# Patient Record
Sex: Female | Born: 1990 | Race: White | Hispanic: No | Marital: Single | Smoking: Never smoker
Health system: Southern US, Community
[De-identification: ages and names within clinical notes are randomized; demographics above are authoritative.]

## PROBLEM LIST (undated history)

## (undated) DIAGNOSIS — N1370 Vesicoureteral-reflux, unspecified: Secondary | ICD-10-CM

## (undated) DIAGNOSIS — R51 Headache: Secondary | ICD-10-CM

## (undated) DIAGNOSIS — R519 Headache, unspecified: Secondary | ICD-10-CM

## (undated) DIAGNOSIS — B019 Varicella without complication: Secondary | ICD-10-CM

## (undated) DIAGNOSIS — J45990 Exercise induced bronchospasm: Secondary | ICD-10-CM

## (undated) HISTORY — DX: Exercise induced bronchospasm: J45.990

## (undated) HISTORY — DX: Headache: R51

## (undated) HISTORY — DX: Headache, unspecified: R51.9

## (undated) HISTORY — DX: Varicella without complication: B01.9

## (undated) HISTORY — DX: Vesicoureteral-reflux, unspecified: N13.70

---

## 2012-08-04 ENCOUNTER — Ambulatory Visit: Payer: Self-pay | Admitting: Family Medicine

## 2014-03-25 LAB — HM PAP SMEAR: HM PAP: NEGATIVE

## 2014-12-13 ENCOUNTER — Ambulatory Visit: Payer: Self-pay | Admitting: Internal Medicine

## 2014-12-19 ENCOUNTER — Encounter: Payer: Self-pay | Admitting: Internal Medicine

## 2014-12-19 ENCOUNTER — Ambulatory Visit (INDEPENDENT_AMBULATORY_CARE_PROVIDER_SITE_OTHER): Payer: BC Managed Care – PPO | Admitting: Internal Medicine

## 2014-12-19 VITALS — BP 120/70 | HR 68 | Temp 98.3°F | Ht 65.5 in | Wt 130.0 lb

## 2014-12-19 DIAGNOSIS — Z7189 Other specified counseling: Secondary | ICD-10-CM

## 2014-12-19 DIAGNOSIS — N920 Excessive and frequent menstruation with regular cycle: Secondary | ICD-10-CM

## 2014-12-19 DIAGNOSIS — J45990 Exercise induced bronchospasm: Secondary | ICD-10-CM | POA: Diagnosis not present

## 2014-12-19 DIAGNOSIS — Z23 Encounter for immunization: Secondary | ICD-10-CM

## 2014-12-19 DIAGNOSIS — J302 Other seasonal allergic rhinitis: Secondary | ICD-10-CM

## 2014-12-19 DIAGNOSIS — Z7184 Encounter for health counseling related to travel: Secondary | ICD-10-CM

## 2014-12-19 MED ORDER — TYPHOID VACCINE PO CPDR: DELAYED_RELEASE_CAPSULE | ORAL | Status: DC

## 2014-12-19 NOTE — Assessment & Plan Note (Signed)
Not an issue as an adult Will continue to monitor

## 2014-12-19 NOTE — Progress Notes (Signed)
Pre visit review using our clinic review tool, if applicable. No additional management support is needed unless otherwise documented below in the visit note. 

## 2014-12-19 NOTE — Progress Notes (Signed)
   Subjective:    Patient ID: Mary Hurst, female    DOB: 06/19/1991, 24 y.o.   MRN: [id number]  HPI    Review of Systems     Objective:   Physical Exam        Assessment & Plan:

## 2014-12-19 NOTE — Assessment & Plan Note (Signed)
Controlled on current therapy ?

## 2014-12-19 NOTE — Progress Notes (Signed)
HPI  Pt presents to the clinic today to establish care and for management of the conditions listed below. She also reports that she is going on a medical missions trip in June to the [place]. She needs to know what vaccines she needs. She did bring her vaccination record with her.  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 Has not been an issue as an adult. She does not even have an albuterol inhaler.  Allergies: Moderate all year long. She takes Flonase, Claritin and Singulair daily.  She also reports she has had a continuous period for the last 3 weeks. She is going through 2-3 tampons per day. She has had some pelvic cramping. She is sexually active but is taking birth control daily. She does have an appt with GYN tomorrow.  Past Medical History  Diagnosis Date  . Exercise-induced asthma   . Chicken pox   . Frequent headaches   . Vesicoureteral reflux     Current Outpatient Prescriptions  Medication Sig Dispense Refill  . fluticasone (FLONASE) 50 MCG/ACT nasal spray Place 2 sprays into both nostrils daily.  12  . loratadine (CLARITIN) 10 MG tablet Take 1 tablet by mouth daily.  12  . montelukast (SINGULAIR) 10 MG tablet Take 10 mg by mouth daily.  6  . VELIVET 0.1/0.125/0.15 -0.025 MG tablet Take 1 tablet by mouth daily.  3   No current facility-administered medications for this visit.    No Known Allergies  Family History  Problem Relation Age of Onset  . Stroke Maternal Grandmother   . Hypertension Maternal Grandmother   . Cancer Maternal Grandfather     Prostate    History   Social History  . Marital Status: Single    Spouse Name: N/A  . Number of Children: N/A  . Years of Education: N/A   Occupational History  . Not on file.   Social History Main Topics  . Smoking status: Never Smoker   . Smokeless tobacco: Never Used  . Alcohol Use: 0.0 oz/week    0 Standard drinks or equivalent per week     Comment: rare  . Drug Use: No  . Sexual Activity: Not on  file   Other Topics Concern  . Not on file   Social History Narrative  . No narrative on file    ROS:  Constitutional: Denies fever, malaise, fatigue, headache or abrupt weight changes.  Respiratory: Denies difficulty breathing, shortness of breath, cough or sputum production.   Cardiovascular: Denies chest pain, chest tightness, palpitations or swelling in the hands or feet.  Gastrointestinal: Pt reports abdominal cramping. Denies bloating, constipation, diarrhea or blood in the stool.  GU: Denies frequency, urgency, pain with urination, blood in urine, odor or discharge. Neurological: Denies dizziness, difficulty with memory, difficulty with speech or problems with balance and coordination.   No other specific complaints in a complete review of systems (except as listed in HPI above).  PE:  BP 120/70 mmHg  Pulse 68  Temp(Src) 98.3 F (36.8 C) (Oral)  Ht 5' 5.5" (1.664 m)  Wt 130 lb (58.968 kg)  BMI 21.30 kg/m2  SpO2 99%  LMP 12/02/2014 Wt Readings from Last 3 Encounters:  12/19/14 130 lb (58.968 kg)    General: Appears her stated age, well developed, well nourished in NAD. Cardiovascular: Normal rate and rhythm. S1,S2 noted.  No murmur, rubs or gallops noted.  Pulmonary/Chest: Normal effort and positive vesicular breath sounds. No respiratory distress. No wheezes, rales or ronchi noted.  Abdomen:  Soft and nontender. Normal bowel sounds, no bruits noted. No distention or masses noted. Liver, spleen and kidneys non palpable. Pelvic: Deferred, she would rather see gyn. Neurological: Alert and oriented.  Psychiatric: Mood and affect normal. Behavior is normal. Judgment and thought content normal.     Assessment and Plan:  Travel Visit:  CDC travel website reviewed She has had TB skin test- negative She has not had Hep A but because she can not complete the series before she goes, she would like to discuss with her parents first eRx for Typhoid vaccine She will get  RX for Malaria vaccine through the program she is going with All other immunizations UTD  Menorrhagia:  Will not do any workup at this time She has appt with GYN tomorrow  RTC in 1 year or sooner if needed

## 2014-12-19 NOTE — Patient Instructions (Signed)

## 2014-12-20 LAB — CBC AND DIFFERENTIAL
HCT: 39 % (ref 36–46)
Hemoglobin: 13.3 g/dL (ref 12.0–16.0)
Platelets: 178 10*3/uL (ref 150–399)
WBC: 4.6 10^3/mL

## 2014-12-20 LAB — TSH: TSH: 1.73 u[IU]/mL (ref 0.41–5.90)

## 2014-12-28 ENCOUNTER — Encounter: Payer: Self-pay | Admitting: Obstetrics & Gynecology

## 2014-12-28 ENCOUNTER — Ambulatory Visit (INDEPENDENT_AMBULATORY_CARE_PROVIDER_SITE_OTHER): Payer: BC Managed Care – PPO | Admitting: Obstetrics & Gynecology

## 2014-12-28 VITALS — BP 114/77 | HR 75 | Ht 72.0 in | Wt 145.6 lb

## 2014-12-28 DIAGNOSIS — N938 Other specified abnormal uterine and vaginal bleeding: Secondary | ICD-10-CM | POA: Diagnosis not present

## 2014-12-28 MED ORDER — MISOPROSTOL 200 MCG PO TABS: ORAL_TABLET | ORAL | Status: DC

## 2014-12-28 NOTE — Progress Notes (Signed)
   Subjective:    Patient ID: Mary Hurst, female    DOB: 03/20/1991, 24 y.o.   MRN: [id number]  HPI  24 yo engaged W G0 here because she has bled for the last 3 weeks. She missed 2 pills of her OCPs and believes that this "kick started" her bleeding. Her periods last about a week, but are heavy since menarche at about 24 yo. She also has cramps with her periods and this is no better with OCPs.  Review of Systems She had a pap smear about a year ago at [facility] and says that it was normal. She has been on OCPs for about a year.  She is a Holiday representativesenior at [employer], graduates in the fall. She had TSH recently and it was reportedly normal. Her hemoglobin was reportedly normal. She reports occasional dyspareunia, monogamous for 2 years     Objective:   Physical Exam  [id number] Breathing and ambulating normally Neuro intact      Assessment & Plan:  Dysparenia/menorrhagia- check gyn u/s, cervical cultures

## 2014-12-30 LAB — GC/CHLAMYDIA PROBE AMP
CT Probe RNA: NEGATIVE
GC Probe RNA: NEGATIVE

## 2015-01-04 ENCOUNTER — Ambulatory Visit: Payer: BC Managed Care – PPO

## 2015-01-04 ENCOUNTER — Ambulatory Visit
Admission: RE | Admit: 2015-01-04 | Discharge: 2015-01-04 | Disposition: A | Discharge status: Home / Self Care | Payer: BC Managed Care – PPO | Source: Ambulatory Visit | Attending: Obstetrics & Gynecology | Admitting: Obstetrics & Gynecology

## 2015-01-04 DIAGNOSIS — N938 Other specified abnormal uterine and vaginal bleeding: Secondary | ICD-10-CM | POA: Diagnosis not present

## 2015-01-09 ENCOUNTER — Encounter: Payer: Self-pay | Admitting: *Deleted

## 2015-01-09 ENCOUNTER — Telehealth: Payer: Self-pay | Admitting: *Deleted

## 2015-01-09 NOTE — Telephone Encounter (Signed)
Notified patient of ultrasound results.  She has additional questions regarding these results including can she still get the [facility] IUD with this variant in her uterus.  Please call patient and discuss with her.  She does have an appointment in two weeks to have Skyla inserted but wants to know if this is even possible in light of the ultrasound results.

## 2015-01-13 ENCOUNTER — Telehealth: Payer: Self-pay | Admitting: Obstetrics & Gynecology

## 2015-01-13 NOTE — Telephone Encounter (Signed)
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 I advised that IUD is probably not the best option for her. She will consider her options.  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

## 2015-01-18 ENCOUNTER — Encounter: Payer: Self-pay | Admitting: Obstetrics and Gynecology

## 2015-01-25 ENCOUNTER — Ambulatory Visit (INDEPENDENT_AMBULATORY_CARE_PROVIDER_SITE_OTHER): Payer: BC Managed Care – PPO | Admitting: Family Medicine

## 2015-01-25 ENCOUNTER — Encounter: Payer: Self-pay | Admitting: Family Medicine

## 2015-01-25 VITALS — BP 107/67 | HR 68 | Wt 143.0 lb

## 2015-01-25 DIAGNOSIS — Q632 Ectopic kidney: Secondary | ICD-10-CM | POA: Diagnosis not present

## 2015-01-25 DIAGNOSIS — Q519 Congenital malformation of uterus and cervix, unspecified: Secondary | ICD-10-CM

## 2015-01-25 DIAGNOSIS — N921 Excessive and frequent menstruation with irregular cycle: Secondary | ICD-10-CM | POA: Diagnosis not present

## 2015-01-25 DIAGNOSIS — Z3041 Encounter for surveillance of contraceptive pills: Secondary | ICD-10-CM | POA: Diagnosis not present

## 2015-01-25 MED ORDER — DESOGEST-ETH ESTRAD TRIPHASIC 0.1/0.125/0.15 -0.025 MG PO TABS
1.0000 | ORAL_TABLET | Freq: Every day | ORAL | Status: DC
Start: 2015-01-25 — End: 2015-04-26

## 2015-01-25 NOTE — Assessment & Plan Note (Signed)
Not be contributing to abnormal bleeding.

## 2015-01-25 NOTE — Patient Instructions (Signed)
Abnormal Uterine Bleeding Abnormal uterine bleeding can affect women at various stages in life, including teenagers, women in their reproductive years, pregnant women, and women who have reached menopause. Several kinds of uterine bleeding are considered abnormal, including:  Bleeding or spotting between periods.   Bleeding after sexual intercourse.   Bleeding that is heavier or more than normal.   Periods that last longer than usual.  Bleeding after menopause.  Many cases of abnormal uterine bleeding are minor and simple to treat, while others are more serious. Any type of abnormal bleeding should be evaluated by your health care provider. Treatment will depend on the cause of the bleeding. HOME CARE INSTRUCTIONS Monitor your condition for any changes. The following actions may help to alleviate any discomfort you are experiencing:  Avoid the use of tampons and douches as directed by your health care provider.  Change your pads frequently. You should get regular pelvic exams and Pap tests. Keep all follow-up appointments for diagnostic tests as directed by your health care provider.  SEEK MEDICAL CARE IF:   Your bleeding lasts more than 1 week.   You feel dizzy at times.  SEEK IMMEDIATE MEDICAL CARE IF:   You pass out.   You are changing pads every 15 to 30 minutes.   You have abdominal pain.  You have a fever.   You become sweaty or weak.   You are passing large blood clots from the vagina.   You start to feel nauseous and vomit. MAKE SURE YOU:   Understand these instructions.  Will watch your condition.  Will get help right away if you are not doing well or get worse. Document Released: 07/15/2005 Document Revised: 07/20/2013 Document Reviewed: 02/11/2013 [facility] Patient Information 2015 [employer] [place]. This information is not intended to replace advice given to you by your health care provider. Make sure you discuss any questions you have with your  health care provider. Oral Contraception Information Oral contraceptive pills (OCPs) are medicines taken to prevent pregnancy. OCPs work by preventing the ovaries from releasing eggs. The hormones in OCPs also cause the cervical mucus to thicken, preventing the sperm from entering the uterus. The hormones also cause the uterine lining to become thin, not allowing a fertilized egg to attach to the inside of the uterus. OCPs are highly effective when taken exactly as prescribed. However, OCPs do not prevent sexually transmitted diseases (STDs). Safe sex practices, such as using condoms along with the pill, can help prevent STDs.  Before taking the pill, you may have a physical exam and Pap test. Your health care provider may order blood tests. The health care provider will make sure you are a good candidate for oral contraception. Discuss with your health care provider the possible side effects of the OCP you may be prescribed. When starting an OCP, it can take 2 to 3 months for the body to adjust to the changes in hormone levels in your body.  TYPES OF ORAL CONTRACEPTION  The combination pill--This pill contains estrogen and progestin (synthetic progesterone) hormones. The combination pill comes in 21-day, 28-day, or 91-day packs. Some types of combination pills are meant to be taken continuously (365-day pills). With 21-day packs, you do not take pills for 7 days after the last pill. With 28-day packs, the pill is taken every day. The last 7 pills are without hormones. Certain types of pills have more than 21 hormone-containing pills. With 91-day packs, the first 84 pills contain both hormones, and the last 7  pills contain no hormones or contain estrogen only. °· The minipill--This pill contains the progesterone hormone only. The pill is taken every day continuously. It is very important to take the pill at the same time each day. The minipill comes in packs of 28 pills. All 28 pills contain the hormone.    °ADVANTAGES OF ORAL CONTRACEPTIVE PILLS °· Decreases premenstrual symptoms.   °· Treats menstrual period cramps.   °· Regulates the menstrual cycle.   °· Decreases a heavy menstrual flow.   °· May treat acne, depending on the type of pill.   °· Treats abnormal uterine bleeding.   °· Treats polycystic ovarian syndrome.   °· Treats endometriosis.   °· Can be used as emergency contraception.   °THINGS THAT CAN MAKE ORAL CONTRACEPTIVE PILLS LESS EFFECTIVE °OCPs can be less effective if:  °· You forget to take the pill at the same time every day.   °· You have a stomach or intestinal disease that lessens the absorption of the pill.   °· You take OCPs with other medicines that make OCPs less effective, such as antibiotics, certain HIV medicines, and some seizure medicines.   °· You take expired OCPs.   °· You forget to restart the pill on day 7, when using the packs of 21 pills.   °RISKS ASSOCIATED WITH ORAL CONTRACEPTIVE PILLS  °Oral contraceptive pills can sometimes cause side effects, such as: °· Headache. °· Nausea. °· Breast tenderness. °· Irregular bleeding or spotting. °Combination pills are also associated with a small increased risk of: °· Blood clots. °· Heart attack. °· Stroke. °Document Released: 10/05/2002 Document Revised: 05/05/2013 Document Reviewed: 01/03/2013 °Exit[employer]® Patient Information ©2015 [employer]. This information is not intended to replace advice given to you by your health care provider. Make sure you discuss any questions you have with your health care provider. ° °

## 2015-01-25 NOTE — Progress Notes (Signed)
    Subjective:    Patient ID: Mary Hurst is a 24 y.o. female presenting with Follow-up  on 01/25/2015  HPI: Here to f/u pelvic sonogram and abnormal bleeding. Reports bleeding without missing pills.  On a 20 mcg pill due to intolerability of 35 mcg pill. 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  Review of Systems  Constitutional: Negative for fever and chills.  Respiratory: Negative for shortness of breath.   Cardiovascular: Negative for chest pain.  Gastrointestinal: Negative for nausea, vomiting and abdominal pain.  Genitourinary: Negative for dysuria.  Skin: Negative for rash.      Objective:    BP 107/67 mmHg  Pulse 68  Wt 143 lb (64.864 kg)  LMP 01/21/2015 Physical Exam  Constitutional: She is oriented to person, place, and time. She appears well-developed and well-nourished. No distress.  HENT:  Head: Normocephalic and atraumatic.  Eyes: No scleral icterus.  Neck: Neck supple.  Cardiovascular: Normal rate.   Pulmonary/Chest: Effort normal.  Abdominal: Soft.  Neurological: She is alert and oriented to person, place, and time.  Skin: Skin is warm and dry.  Psychiatric: She has a normal mood and affect.        Assessment & Plan:   Problem List Items Addressed This Visit      Unprioritized   Menorrhagia with irregular cycle - Primary    Probably related to 20 mcg pill.  Increase to 25 mcg pill and see how she does.  This is a triphasic, but hopefully will improve bleeding profile.  If not, could consider 30 mcg monophasic pill.      Relevant Medications   desogestrel-ethinyl estradiol (CYCLESSA) 0.1/0.125/0.15 -0.025 MG tablet   Uterine anomaly    Not be contributing to abnormal bleeding.      Pelvic kidney       Return in about 3 months (around 04/27/2015).  Shafer Swamy S 01/25/2015 10:36 AM

## 2015-01-25 NOTE — Assessment & Plan Note (Signed)
Probably related to 20 mcg pill.  Increase to 25 mcg pill and see how she does.  This is a triphasic, but hopefully will improve bleeding profile.  If not, could consider 30 mcg monophasic pill.

## 2015-01-26 MED ORDER — NORETHINDRONE ACET-ETHINYL EST 1.5-30 MG-MCG PO TABS: 1.0000 | ORAL_TABLET | Freq: Every day | ORAL | Status: DC

## 2015-01-26 NOTE — Addendum Note (Signed)
Addended by: Reva BoresPRATT, Omar Orrego S on: 01/26/2015 09:14 AM   Modules accepted: Orders

## 2015-02-08 ENCOUNTER — Other Ambulatory Visit: Payer: Self-pay | Admitting: Family Medicine

## 2015-02-08 DIAGNOSIS — Q519 Congenital malformation of uterus and cervix, unspecified: Secondary | ICD-10-CM

## 2015-02-20 ENCOUNTER — Ambulatory Visit (HOSPITAL_COMMUNITY): Payer: BC Managed Care – PPO

## 2015-04-26 ENCOUNTER — Encounter: Payer: Self-pay | Admitting: Podiatry

## 2015-04-26 ENCOUNTER — Ambulatory Visit (INDEPENDENT_AMBULATORY_CARE_PROVIDER_SITE_OTHER): Payer: BC Managed Care – PPO | Admitting: Podiatry

## 2015-04-26 ENCOUNTER — Ambulatory Visit (INDEPENDENT_AMBULATORY_CARE_PROVIDER_SITE_OTHER): Payer: BC Managed Care – PPO

## 2015-04-26 VITALS — BP 125/67 | HR 72 | Resp 16 | Ht 66.0 in | Wt 140.0 lb

## 2015-04-26 DIAGNOSIS — M79672 Pain in left foot: Secondary | ICD-10-CM

## 2015-04-26 DIAGNOSIS — L600 Ingrowing nail: Secondary | ICD-10-CM

## 2015-04-26 MED ORDER — NEOMYCIN-POLYMYXIN-HC 3.5-10000-1 OT SOLN: OTIC | Status: DC

## 2015-04-26 MED ORDER — HYDROCODONE-ACETAMINOPHEN 5-325 MG PO TABS: 1.0000 | ORAL_TABLET | Freq: Four times a day (QID) | ORAL | Status: DC | PRN

## 2015-04-26 NOTE — Progress Notes (Signed)
Subjective:     Patient ID: Mary Hurst, female   DOB: 1991/07/16, 24 y.o.   MRN: [id number]  HPI patient states I've had bad ingrown toenails on both my big toes for a long time and worse over the last year. Also my fourth toe on my left foot is been sore red bumped it 4 months ago and I'm worried I may have broken   Review of Systems  All other systems reviewed and are negative.      Objective:   Physical Exam  Constitutional: She is oriented to person, place, and time.  Cardiovascular: Intact distal pulses.   Musculoskeletal: Normal range of motion.  Neurological: She is oriented to person, place, and time.  Skin: Skin is warm.  Nursing note and vitals reviewed.  neurovascular status intact muscle strength adequate range of motion within normal limits. Patient's noted to have good digital perfusion is well oriented 3 and has incurvated hallux nailbeds bilateral with both pain in the medial and lateral side. Fourth toe left it's painful at the interphalangeal joint proximal with minimal edema noted and no indications of obvious clinical changes in the toe itself     Assessment:     Ingrown toenail deformity hallux bilateral medial lateral borders and possible fracture or arthritis of the fourth digit left foot    Plan:     H&P and x-rays of left foot were evaluated and discussed. At this time I've recommended correction of ingrown toenails and I reviewed procedures and risk and patient wants surgery. I went ahead today and I infiltrated each hallux 60 Milligan times like Marcaine mixture remove the medial and lateral borders and exposed matrix and applied phenol 3 applications 30 seconds followed by alcohol lavage and sterile dressing. Gave instructions on soaks and reappoint and also she will continue to monitor the fourth toe left foot

## 2015-04-26 NOTE — Progress Notes (Signed)
   Subjective:    Patient ID: Mary Hurst, female    DOB: Feb 27, 1991, 24 y.o.   MRN: [id number]  HPI Patient presents with bilateral nail problem; ingrown toenail; great toes-on both sides. This has been going on for the past year.   Review of Systems  All other systems reviewed and are negative.      Objective:   Physical Exam        Assessment & Plan:

## 2015-04-26 NOTE — Patient Instructions (Signed)

## 2015-04-27 ENCOUNTER — Telehealth: Payer: Self-pay | Admitting: *Deleted

## 2015-04-27 ENCOUNTER — Encounter: Payer: Self-pay | Admitting: *Deleted

## 2015-04-27 NOTE — Telephone Encounter (Signed)
Called patient at [phone] (Cell #) to check to see how they were feeling from their ingrown toenail procedure that was performed on Thursday, April 26, 2015. Pt stated, "was in pain; walking is painful". Pt is taking ibuprofen and will be soaking her toe tonight. 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 Dr. Charlsie Merles has approved this time off from class. Pt will be picking up her note tomorrow afternoon.

## 2015-04-27 NOTE — Telephone Encounter (Signed)
Pt request a note to exempt her from swim classes for 4 weeks, after B/L toenail procedures 04/26/2015. DR. Everlena Cooper.  Done pt to pick up in [place] office.

## 2015-04-28 ENCOUNTER — Encounter: Payer: Self-pay | Admitting: Podiatry

## 2015-04-28 DIAGNOSIS — L600 Ingrowing nail: Secondary | ICD-10-CM

## 2015-05-01 ENCOUNTER — Telehealth: Payer: Self-pay | Admitting: *Deleted

## 2015-05-01 DIAGNOSIS — I82401 Acute embolism and thrombosis of unspecified deep veins of right lower extremity: Secondary | ICD-10-CM

## 2015-05-01 NOTE — Telephone Encounter (Addendum)
Pt call about the toenail procedure. Pt states she needs statement saying the toenails really needed the toenail removed at this time.  Can also [phone] mother if she can't be reached.  Left message to pick up letter for college.  Pt states she may have to pick up letter tomorrow.

## 2015-05-02 ENCOUNTER — Encounter: Payer: Self-pay | Admitting: *Deleted

## 2015-05-04 DIAGNOSIS — M79672 Pain in left foot: Secondary | ICD-10-CM

## 2015-05-05 ENCOUNTER — Encounter: Payer: Self-pay | Admitting: Physician Assistant

## 2015-05-05 ENCOUNTER — Telehealth: Payer: Self-pay | Admitting: Physician Assistant

## 2015-05-05 NOTE — Telephone Encounter (Signed)
LMTCB  Thanks,  -Kimble Delaurentis 

## 2015-05-05 NOTE — Telephone Encounter (Signed)
Pt advised-aa 

## 2015-05-05 NOTE — Telephone Encounter (Signed)
Note up front for pick up

## 2015-05-05 NOTE — Telephone Encounter (Signed)
Pt stated she needs to pick up a letter stating that we prescribe her birth control b/c she is joining the [employer] and they require this document. Thanks TNP

## 2015-05-05 NOTE — Telephone Encounter (Signed)
Her birth control was actually prescribed to her by Dr. Shawnie Pons with [facility] for [facility].

## 2015-05-05 NOTE — Telephone Encounter (Signed)
Spoke with patient and she states she has been getting birth control here with [facility] before (it is in the old system). She states she went to the other place over the summer due to some complications she had but otherwise she has been coming here. Please review and let her know thank you-aa

## 2015-05-05 NOTE — Telephone Encounter (Signed)
Please advise. Thanks,

## 2015-05-09 ENCOUNTER — Telehealth: Payer: Self-pay

## 2015-05-09 NOTE — Telephone Encounter (Signed)
Patient's mother Karoline Caldwell) called saying that now patient needs a letter saying why she takes allergy medications. 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 Could you please print out a note saying this also? Thanks! Please call [phone] when ready for pick up.

## 2015-05-10 ENCOUNTER — Encounter: Payer: Self-pay | Admitting: Physician Assistant

## 2015-05-10 NOTE — Telephone Encounter (Signed)
Patient advised as directed below.  Thanks,  -Adley Castello 

## 2015-05-10 NOTE — Telephone Encounter (Signed)
Please notify patient letter has been printed and has been placed up front for pick up.  Thanks.

## 2015-07-07 ENCOUNTER — Encounter: Payer: Self-pay | Admitting: Physician Assistant

## 2015-07-07 ENCOUNTER — Ambulatory Visit (INDEPENDENT_AMBULATORY_CARE_PROVIDER_SITE_OTHER): Payer: BC Managed Care – PPO | Admitting: Physician Assistant

## 2015-07-07 VITALS — BP 110/70 | HR 68 | Temp 97.8°F | Resp 16 | Wt 147.8 lb

## 2015-07-07 DIAGNOSIS — J0100 Acute maxillary sinusitis, unspecified: Secondary | ICD-10-CM

## 2015-07-07 MED ORDER — AZITHROMYCIN 250 MG PO TABS: ORAL_TABLET | ORAL | Status: DC

## 2015-07-07 NOTE — Progress Notes (Signed)
Patient: Mary Hurst Female    DOB: 01/02/1991   24 y.o.   MRN: [id number] Visit Date: 07/07/2015  Today's Provider: Margaretann LovelessJennifer M Lakesha Levinson, PA-C   Chief Complaint  Patient presents with  . Sinusitis   Subjective:    Sinusitis This is a new problem. The current episode started 1 to 4 weeks ago. The problem has been gradually worsening since onset. There has been no fever. Associated symptoms include congestion, coughing, sinus pressure, sneezing and a sore throat. Pertinent negatives include no chills, ear pain (ear fullness), headaches or shortness of breath. Past treatments include oral decongestants (Nyquil, sudafed, mucinex). The treatment provided no relief.  She states that she used to get recurrent sinus infections up until this year. She was started on Singulair, Claritin and Flonase and has been able to control getting sinus infections with this regimen. This is her first sinus infection this year. She has been around a sick contact as well. She states her mother was diagnosed with sinus infection approximately 2-3 weeks ago.     No Known Allergies Previous Medications   ALBUTEROL (VENTOLIN HFA) 108 (90 BASE) MCG/ACT INHALER    Inhale into the lungs.   FLUTICASONE (FLONASE) 50 MCG/ACT NASAL SPRAY    Place 2 sprays into both nostrils daily.   HYDROCODONE-ACETAMINOPHEN (NORCO/VICODIN) 5-325 MG TABLET    Take 1 tablet by mouth every 6 (six) hours as needed for moderate pain.   LORATADINE (CLARITIN) 10 MG TABLET    Take 1 tablet by mouth daily.   MONTELUKAST (SINGULAIR) 10 MG TABLET    Take 10 mg by mouth daily.   NORETHINDRONE ACETATE-ETHINYL ESTRADIOL (JUNEL,LOESTRIN,MICROGESTIN) 1.5-30 MG-MCG TABLET    Take 1 tablet by mouth daily.    Review of Systems  Constitutional: Negative for fever, chills and fatigue.  HENT: Positive for congestion, postnasal drip, rhinorrhea, sinus pressure, sneezing and sore throat. Negative for ear pain (ear fullness) and trouble swallowing.    Eyes: Negative.   Respiratory: Positive for cough. Negative for chest tightness, shortness of breath and wheezing.   Cardiovascular: Negative.   Gastrointestinal: Negative.   Neurological: Negative for dizziness and headaches.    Social History  Substance Use Topics  . Smoking status: Never Smoker   . Smokeless tobacco: Never Used  . Alcohol Use: 0.0 oz/week    0 Standard drinks or equivalent per week     Comment: rare   Objective:   BP 110/70 mmHg  Pulse 68  Temp(Src) 97.8 F (36.6 C) (Oral)  Resp 16  Wt 147 lb 12.8 oz (67.042 kg)  LMP 06/26/2015  Physical Exam  Constitutional: She appears well-developed and well-nourished. No distress.  HENT:  Head: Normocephalic and atraumatic.  Right Ear: Hearing, external ear and ear canal normal. A middle ear effusion is present.  Left Ear: Hearing, external ear and ear canal normal. A middle ear effusion is present.  Nose: Mucosal edema and rhinorrhea present. Right sinus exhibits maxillary sinus tenderness and frontal sinus tenderness. Left sinus exhibits maxillary sinus tenderness and frontal sinus tenderness.  Mouth/Throat: Uvula is midline, oropharynx is clear and moist and mucous membranes are normal. No oropharyngeal exudate.  Eyes: Conjunctivae are normal. Pupils are equal, round, and reactive to light. Right eye exhibits no discharge. Left eye exhibits no discharge.  Neck: Normal range of motion. Neck supple. No tracheal deviation present. No thyromegaly present.  Cardiovascular: Normal rate, regular rhythm and normal heart sounds.  Exam reveals no gallop and no  friction rub.   No murmur heard. Pulmonary/Chest: Effort normal and breath sounds normal. No stridor. No respiratory distress. She has no wheezes. She has no rales.  Lymphadenopathy:    She has no cervical adenopathy.  Skin: She is not diaphoretic.  Vitals reviewed.       Assessment & Plan:     1. Acute maxillary sinusitis, recurrence not specified Worsening  over 2 weeks without responding to over-the-counter decongestants and cold medicine. I will prescribe a Z-Pak as below for treatment of the sinusitis. She states that this antibiotic has worked well for her in the past. I also advised her to continue Sudafed as needed for the congestion. I also advised to make sure to stay well-hydrated and to get plenty of rest. She is to call the office if symptoms fail to improve or worsen. I also advised her to call the office if she does not see any improvement within 7-10 days. - azithromycin (ZITHROMAX) 250 MG tablet; Take 2 tablets PO on day one, and one tablet PO daily thereafter until completed.  Dispense: 6 tablet; Refill: 0       Margaretann Loveless, PA-C  [facility]

## 2015-07-07 NOTE — Patient Instructions (Signed)

## 2015-07-12 ENCOUNTER — Telehealth: Payer: Self-pay | Admitting: Physician Assistant

## 2015-07-12 DIAGNOSIS — J0100 Acute maxillary sinusitis, unspecified: Secondary | ICD-10-CM

## 2015-07-12 MED ORDER — AZITHROMYCIN 250 MG PO TABS
ORAL_TABLET | ORAL | Status: DC
Start: 2015-07-12 — End: 2015-07-20

## 2015-07-12 NOTE — Telephone Encounter (Signed)
Pt states she was seen last week.  Pt is a little better.  Pt is have drainage in her ears and throat, sinus pressure and runny nose.  Pt is [facility] another Rx.  [facility] [address]  [phone]#[phone]/MW

## 2015-07-12 NOTE — Telephone Encounter (Signed)
Will try second round of z-pak.  [facility] [address] Please call if still no improvement in 7 days.  Thanks! -JB

## 2015-07-13 NOTE — Telephone Encounter (Signed)
Left a detail message that prescription of another round of [facility]ak was [facility] [address]. Advised as directed below. If any question or concerns to call back  Thanks,  -Elijha Dedman

## 2015-07-20 ENCOUNTER — Ambulatory Visit (INDEPENDENT_AMBULATORY_CARE_PROVIDER_SITE_OTHER): Payer: BC Managed Care – PPO | Admitting: Physician Assistant

## 2015-07-20 ENCOUNTER — Encounter: Payer: Self-pay | Admitting: Physician Assistant

## 2015-07-20 VITALS — BP 102/70 | HR 98 | Temp 97.7°F | Resp 16 | Wt 144.6 lb

## 2015-07-20 DIAGNOSIS — R059 Cough, unspecified: Secondary | ICD-10-CM

## 2015-07-20 DIAGNOSIS — R05 Cough: Secondary | ICD-10-CM | POA: Diagnosis not present

## 2015-07-20 DIAGNOSIS — J0140 Acute pansinusitis, unspecified: Secondary | ICD-10-CM | POA: Diagnosis not present

## 2015-07-20 DIAGNOSIS — J029 Acute pharyngitis, unspecified: Secondary | ICD-10-CM | POA: Diagnosis not present

## 2015-07-20 DIAGNOSIS — H66002 Acute suppurative otitis media without spontaneous rupture of ear drum, left ear: Secondary | ICD-10-CM | POA: Diagnosis not present

## 2015-07-20 MED ORDER — AMOXICILLIN-POT CLAVULANATE 875-125 MG PO TABS: 1.0000 | ORAL_TABLET | Freq: Two times a day (BID) | ORAL | Status: DC

## 2015-07-20 NOTE — Patient Instructions (Signed)

## 2015-07-20 NOTE — Progress Notes (Signed)
Patient: Mary Hurst Female    DOB: [birth year]   24 y.o.   MRN: [id number] Visit Date: 07/20/2015  Today's Provider: Margaretann Loveless, PA-C   Chief Complaint  Patient presents with  . Sinusitis   Subjective:    Sinusitis This is a recurrent problem. The current episode started 1 to 4 weeks ago. The problem has been gradually worsening since onset. There has been no fever. Associated symptoms include chills, congestion, coughing, ear pain (left > right), headaches, shortness of breath, sinus pressure and sneezing. Pertinent negatives include no sore throat. Past treatments include acetaminophen (Took the Zpak, mucinex,Sudafed). The treatment provided no relief.    she does state that after the second round of azithromycin she actually did feel better but over this past weekend she traveled to [place] to visit family and was around a lot of children as well as a lot of people in airports. She got home on Sunday and noticed that she was starting to feel congested again so she started using the Sudafed. She continued taking Mucinex and Sudafed as well as her allergy medications throughout the week and then yesterday she thinks she may have had a low-grade fever and she started developing the ear pain and headaches.    No Known Allergies Previous Medications   AZITHROMYCIN (ZITHROMAX) 250 MG TABLET    Take 2 tablets PO on day one, and one tablet PO daily thereafter until completed.   FLUTICASONE (FLONASE) 50 MCG/ACT NASAL SPRAY    Place 2 sprays into both nostrils daily.   LORATADINE (CLARITIN) 10 MG TABLET    Take 1 tablet by mouth daily.   MONTELUKAST (SINGULAIR) 10 MG TABLET    Take 10 mg by mouth daily.   NORETHINDRONE ACETATE-ETHINYL ESTRADIOL (JUNEL,LOESTRIN,MICROGESTIN) 1.5-30 MG-MCG TABLET    Take 1 tablet by mouth daily.    Review of Systems  Constitutional: Positive for fever (possibly low grade yesterday) and chills.  HENT: Positive for congestion, ear pain (left  > right), postnasal drip, rhinorrhea, sinus pressure, sneezing and trouble swallowing. Negative for sore throat, tinnitus and voice change.   Eyes: Negative.   Respiratory: Positive for cough and shortness of breath. Negative for chest tightness and wheezing.   Cardiovascular: Negative for chest pain.  Gastrointestinal: Negative for nausea, vomiting and abdominal pain.  Neurological: Positive for dizziness and headaches.    Social History  Substance Use Topics  . Smoking status: Never Smoker   . Smokeless tobacco: Never Used  . Alcohol Use: 0.0 oz/week    0 Standard drinks or equivalent per week     Comment: rare   Objective:   BP 102/70 mmHg  Pulse 98  Temp(Src) 97.7 F (36.5 C) (Oral)  Resp 16  Wt 144 lb 9.6 oz (65.59 kg)  SpO2 98%  LMP 06/26/2015  Physical Exam  Constitutional: She appears well-developed and well-nourished. No distress.  HENT:  Head: Normocephalic and atraumatic.  Right Ear: Hearing, external ear and ear canal normal. Tympanic membrane is not erythematous and not bulging. A middle ear effusion is present.  Left Ear: Hearing, external ear and ear canal normal. Tympanic membrane is erythematous and bulging. A middle ear effusion is present.  Nose: Mucosal edema and rhinorrhea present. Right sinus exhibits maxillary sinus tenderness and frontal sinus tenderness. Left sinus exhibits maxillary sinus tenderness and frontal sinus tenderness.  Mouth/Throat: Uvula is midline and mucous membranes are normal. Posterior oropharyngeal erythema present. No oropharyngeal exudate or posterior oropharyngeal edema.  Neck: Normal range of motion. Neck supple. No tracheal deviation present. No thyromegaly present.  Cardiovascular: Normal rate, regular rhythm and normal heart sounds.  Exam reveals no gallop and no friction rub.   No murmur heard. Pulmonary/Chest: Effort normal and breath sounds normal. No stridor. No respiratory distress. She has no wheezes. She has no rales.    Lymphadenopathy:    She has no cervical adenopathy.  Skin: She is not diaphoretic.  Vitals reviewed.       Assessment & Plan:     1. Acute suppurative otitis media of left ear without spontaneous rupture of tympanic membrane, recurrence not specified Worsening. I will treat with Augmentin as below. She is to continue to using Mucinex or Sudafed for decongestion, Tylenol for fevers. She needs to make sure to try to get plenty of rest and to stay well-hydrated. She is to call the office if symptoms felt to improve or worsen. - amoxicillin-clavulanate (AUGMENTIN) 875-125 MG tablet; Take 1 tablet by mouth 2 (two) times daily.  Dispense: 20 tablet; Refill: 0  2. Acute pansinusitis, recurrence not specified Worsening. I will treat with Augmentin as below. She is to continue using her allergy medications. I also advised that she would probably benefit from using her Netty pot as well. She voiced understanding. She is to also stay well-hydrated and try to get plenty of rest over the holiday weekend. She is to call the office if symptoms fail to improve or worsen. - amoxicillin-clavulanate (AUGMENTIN) 875-125 MG tablet; Take 1 tablet by mouth 2 (two) times daily.  Dispense: 20 tablet; Refill: 0  3. Pharyngitis She states that her throat is not as bad as it was yesterday. I told her that she may use Advil or Tylenol for the pain and she may continue using saltwater gargles as well. She is to call the office if symptoms fail to improve or worsen.  4. Cough She states the cough is not terrible today. She does not wish to use prednisone as she thinks it makes her crazy. She also does not feel that she needs a narcotic cough medication at this time as she is still able to sleep through the night. She may continue to use Mucinex for cough suppression as needed. She is to call the office if symptoms fail to improve or worsen.       Margaretann LovelessJennifer M Burnette, PA-C  [facility]

## 2015-07-26 ENCOUNTER — Telehealth: Payer: Self-pay | Admitting: Physician Assistant

## 2015-07-26 NOTE — Telephone Encounter (Signed)
Pt is requesting a letter written to explain medications she is taking for enrollment to join the [employer].  Pt is requesting a call back if possible to explain further.  CB#[phone]/MW

## 2015-07-26 NOTE — Telephone Encounter (Signed)
Pt is returning call.  Pt is requesting a call back from Jenni/MW

## 2015-07-26 NOTE — Telephone Encounter (Signed)
[id number] 

## 2015-07-26 NOTE — Telephone Encounter (Signed)
Spoke with patient. She knows that we have written letters for her, she went to a doctor for the [employer]- letter that states that she takes Flonase and Loratadine for precaution measures to not get a sinus infection and that is not a daily medication that she needs, and is not life or death if she does not have it with her. 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 Is there anyway we can email it to her? i told her i dont think we have a secure way in doing that and she understood. She would need this letter by tomorrow if at all possible.  She said thank you so much for helping her out with all of this-aa

## 2015-07-26 NOTE — Telephone Encounter (Signed)
Will you call her? She was requesting you or i can try and call her back again. Please review-aa

## 2015-07-26 NOTE — Telephone Encounter (Signed)
Can we just see which meds she is needing a letter for?  If it is for her inhaler and birth control (I think) those letters have been done we just can reprint them.  Thanks!

## 2015-07-27 ENCOUNTER — Encounter: Payer: Self-pay | Admitting: Physician Assistant

## 2015-07-27 NOTE — Telephone Encounter (Signed)
Please notify patient and that led her has been printed and will be placed up from for her to pick up at her convenience.

## 2015-07-27 NOTE — Telephone Encounter (Signed)
Pt advised on her voicemail

## 2015-08-11 ENCOUNTER — Other Ambulatory Visit: Payer: Self-pay | Admitting: Family Medicine

## 2015-09-01 ENCOUNTER — Other Ambulatory Visit: Payer: Self-pay | Admitting: Family Medicine

## 2015-09-21 ENCOUNTER — Other Ambulatory Visit: Payer: Self-pay | Admitting: Family Medicine

## 2015-09-25 ENCOUNTER — Telehealth: Payer: Self-pay | Admitting: *Deleted

## 2015-09-25 DIAGNOSIS — Z3041 Encounter for surveillance of contraceptive pills: Secondary | ICD-10-CM

## 2015-09-25 MED ORDER — NORETHINDRONE ACET-ETHINYL EST 1.5-30 MG-MCG PO TABS: 1.0000 | ORAL_TABLET | Freq: Every day | ORAL | Status: DC

## 2015-09-25 NOTE — Telephone Encounter (Signed)
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  Sent rx to pharmacy, notified pt.

## 2015-09-26 ENCOUNTER — Other Ambulatory Visit: Payer: Self-pay | Admitting: Physician Assistant

## 2015-09-26 DIAGNOSIS — J302 Other seasonal allergic rhinitis: Secondary | ICD-10-CM

## 2015-09-26 MED ORDER — MONTELUKAST SODIUM 10 MG PO TABS: 10.0000 mg | ORAL_TABLET | Freq: Every day | ORAL | Status: DC

## 2015-09-26 MED ORDER — LORATADINE 10 MG PO TABS: 10.0000 mg | ORAL_TABLET | Freq: Every day | ORAL | Status: DC

## 2015-09-26 MED ORDER — FLUTICASONE PROPIONATE 50 MCG/ACT NA SUSP: 2.0000 | Freq: Every day | NASAL | Status: DC

## 2015-09-26 NOTE — Telephone Encounter (Signed)
Pt contacted office for refill request on the following medications:  [facility] [address]  CB#[phone]/MW  fluticasone (FLONASE) 50 MCG/ACT nasal spray  loratadine (CLARITIN) 10 MG tablet  montelukast (SINGULAIR) 10 MG tablet

## 2015-09-26 NOTE — Telephone Encounter (Signed)
Patient advised that prescriptions for Claritin,Flonase,and Singulair had beeb sent [facility] [address].  Thanks,  -Joseline

## 2015-09-29 NOTE — Telephone Encounter (Signed)
Opened in error

## 2015-10-04 ENCOUNTER — Ambulatory Visit (INDEPENDENT_AMBULATORY_CARE_PROVIDER_SITE_OTHER): Payer: BC Managed Care – PPO | Admitting: Physician Assistant

## 2015-10-04 ENCOUNTER — Encounter: Payer: Self-pay | Admitting: Physician Assistant

## 2015-10-04 VITALS — BP 120/60 | HR 80 | Temp 97.7°F | Resp 16 | Ht 67.0 in | Wt 146.2 lb

## 2015-10-04 DIAGNOSIS — Z124 Encounter for screening for malignant neoplasm of cervix: Secondary | ICD-10-CM | POA: Diagnosis not present

## 2015-10-04 DIAGNOSIS — Z0000 Encounter for general adult medical examination without abnormal findings: Secondary | ICD-10-CM | POA: Diagnosis not present

## 2015-10-04 NOTE — Patient Instructions (Signed)
Health Maintenance, Female Adopting a healthy lifestyle and getting preventive care can go a long way to promote health and wellness. Talk with your health care provider about what schedule of regular examinations is right for you. This is a good chance for you to check in with your provider about disease prevention and staying healthy. In between checkups, there are plenty of things you can do on your own. Experts have done a lot of research about which lifestyle changes and preventive measures are most likely to keep you healthy. Ask your health care provider for more information. WEIGHT AND DIET  Eat a healthy diet  Be sure to include plenty of vegetables, fruits, low-fat dairy products, and lean protein.  Do not eat a lot of foods high in solid fats, added sugars, or salt.  Get regular exercise. This is one of the most important things you can do for your health.  Most adults should exercise for at least 150 minutes each week. The exercise should increase your heart rate and make you sweat (moderate-intensity exercise).  Most adults should also do strengthening exercises at least twice a week. This is in addition to the moderate-intensity exercise.  Maintain a healthy weight  Body mass index (BMI) is a measurement that can be used to identify possible weight problems. It estimates body fat based on height and weight. Your health care provider can help determine your BMI and help you achieve or maintain a healthy weight.  For females 72 years of age and older:   A BMI below 18.5 is considered underweight.  A BMI of 18.5 to 24.9 is normal.  A BMI of 25 to 29.9 is considered overweight.  A BMI of 30 and above is considered obese.  Watch levels of cholesterol and blood lipids  You should start having your blood tested for lipids and cholesterol at 23 years of age, then have this test every 5 years.  You may need to have your cholesterol levels checked more often if:  Your lipid  or cholesterol levels are high.  You are older than 23 years of age.  You are at high risk for heart disease.  CANCER SCREENING   Lung Cancer  Lung cancer screening is recommended for adults 76-21 years old who are at high risk for lung cancer because of a history of smoking.  A yearly low-dose CT scan of the lungs is recommended for people who:  Currently smoke.  Have quit within the past 15 years.  Have at least a 30-pack-year history of smoking. A pack year is smoking an average of one pack of cigarettes a day for 1 year.  Yearly screening should continue until it has been 15 years since you quit.  Yearly screening should stop if you develop a health problem that would prevent you from having lung cancer treatment.  Breast Cancer  Practice breast self-awareness. This means understanding how your breasts normally appear and feel.  It also means doing regular breast self-exams. Let your health care provider know about any changes, no matter how small.  If you are in your 20s or 30s, you should have a clinical breast exam (CBE) by a health care provider every 1-3 years as part of a regular health exam.  If you are 41 or older, have a CBE every year. Also consider having a breast X-ray (mammogram) every year.  If you have a family history of breast cancer, talk to your health care provider about genetic screening.  If you  are at high risk for breast cancer, talk to your health care provider about having an MRI and a mammogram every year.  Breast cancer gene (BRCA) assessment is recommended for women who have family members with BRCA-related cancers. BRCA-related cancers include:  Breast.  Ovarian.  Tubal.  Peritoneal cancers.  Results of the assessment will determine the need for genetic counseling and BRCA1 and BRCA2 testing. Cervical Cancer Your health care provider may recommend that you be screened regularly for cancer of the pelvic organs (ovaries, uterus, and  vagina). This screening involves a pelvic examination, including checking for microscopic changes to the surface of your cervix (Pap test). You may be encouraged to have this screening done every 3 years, beginning at age 21.  For women ages 30-65, health care providers may recommend pelvic exams and Pap testing every 3 years, or they may recommend the Pap and pelvic exam, combined with testing for human papilloma virus (HPV), every 5 years. Some types of HPV increase your risk of cervical cancer. Testing for HPV may also be done on women of any age with unclear Pap test results.  Other health care providers may not recommend any screening for nonpregnant women who are considered low risk for pelvic cancer and who do not have symptoms. Ask your health care provider if a screening pelvic exam is right for you.  If you have had past treatment for cervical cancer or a condition that could lead to cancer, you need Pap tests and screening for cancer for at least 20 years after your treatment. If Pap tests have been discontinued, your risk factors (such as having a new sexual partner) need to be reassessed to determine if screening should resume. Some women have medical problems that increase the chance of getting cervical cancer. In these cases, your health care provider may recommend more frequent screening and Pap tests. Colorectal Cancer  This type of cancer can be detected and often prevented.  Routine colorectal cancer screening usually begins at 23 years of age and continues through 23 years of age.  Your health care provider may recommend screening at an earlier age if you have risk factors for colon cancer.  Your health care provider may also recommend using home test kits to check for hidden blood in the stool.  A small camera at the end of a tube can be used to examine your colon directly (sigmoidoscopy or colonoscopy). This is done to check for the earliest forms of colorectal  cancer.  Routine screening usually begins at age 50.  Direct examination of the colon should be repeated every 5-10 years through 23 years of age. However, you may need to be screened more often if early forms of precancerous polyps or small growths are found. Skin Cancer  Check your skin from head to toe regularly.  Tell your health care provider about any new moles or changes in moles, especially if there is a change in a mole's shape or color.  Also tell your health care provider if you have a mole that is larger than the size of a pencil eraser.  Always use sunscreen. Apply sunscreen liberally and repeatedly throughout the day.  Protect yourself by wearing long sleeves, pants, a wide-brimmed hat, and sunglasses whenever you are outside. HEART DISEASE, DIABETES, AND HIGH BLOOD PRESSURE   High blood pressure causes heart disease and increases the risk of stroke. High blood pressure is more likely to develop in:  People who have blood pressure in the high end   of the normal range (130-139/85-89 mm Hg).  People who are overweight or obese.  People who are African American.  If you are 38-23 years of age, have your blood pressure checked every 3-5 years. If you are 61 years of age or older, have your blood pressure checked every year. You should have your blood pressure measured twice--once when you are at a hospital or clinic, and once when you are not at a hospital or clinic. Record the average of the two measurements. To check your blood pressure when you are not at a hospital or clinic, you can use:  An automated blood pressure machine at a pharmacy.  A home blood pressure monitor.  If you are between 45 years and 39 years old, ask your health care provider if you should take aspirin to prevent strokes.  Have regular diabetes screenings. This involves taking a blood sample to check your fasting blood sugar level.  If you are at a normal weight and have a low risk for diabetes,  have this test once every three years after 23 years of age.  If you are overweight and have a high risk for diabetes, consider being tested at a younger age or more often. PREVENTING INFECTION  Hepatitis B  If you have a higher risk for hepatitis B, you should be screened for this virus. You are considered at high risk for hepatitis B if:  You were born in a country where hepatitis B is common. Ask your health care provider which countries are considered high risk.  Your parents were born in a high-risk country, and you have not been immunized against hepatitis B (hepatitis B vaccine).  You have HIV or AIDS.  You use needles to inject street drugs.  You live with someone who has hepatitis B.  You have had sex with someone who has hepatitis B.  You get hemodialysis treatment.  You take certain medicines for conditions, including cancer, organ transplantation, and autoimmune conditions. Hepatitis C  Blood testing is recommended for:  Everyone born from [birth year] through 1963.  Anyone with known risk factors for hepatitis C. Sexually transmitted infections (STIs)  You should be screened for sexually transmitted infections (STIs) including gonorrhea and chlamydia if:  You are sexually active and are younger than 24 years of age.  You are older than 23 years of age and your health care provider tells you that you are at risk for this type of infection.  Your sexual activity has changed since you were last screened and you are at an increased risk for chlamydia or gonorrhea. Ask your health care provider if you are at risk.  If you do not have HIV, but are at risk, it may be recommended that you take a prescription medicine daily to prevent HIV infection. This is called pre-exposure prophylaxis (PrEP). You are considered at risk if:  You are sexually active and do not regularly use condoms or know the HIV status of your partner(s).  You take drugs by injection.  You are sexually  active with a partner who has HIV. Talk with your health care provider about whether you are at high risk of being infected with HIV. If you choose to begin PrEP, you should first be tested for HIV. You should then be tested every 3 months for as long as you are taking PrEP.  PREGNANCY   If you are premenopausal and you may become pregnant, ask your health care provider about preconception counseling.  If you may  become pregnant, take 400 to 800 micrograms (mcg) of folic acid every day.  If you want to prevent pregnancy, talk to your health care provider about birth control (contraception). OSTEOPOROSIS AND MENOPAUSE   Osteoporosis is a disease in which the bones lose minerals and strength with aging. This can result in serious bone fractures. Your risk for osteoporosis can be identified using a bone density scan.  If you are 71 years of age or older, or if you are at risk for osteoporosis and fractures, ask your health care provider if you should be screened.  Ask your health care provider whether you should take a calcium or vitamin D supplement to lower your risk for osteoporosis.  Menopause may have certain physical symptoms and risks.  Hormone replacement therapy may reduce some of these symptoms and risks. Talk to your health care provider about whether hormone replacement therapy is right for you.  HOME CARE INSTRUCTIONS   Schedule regular health, dental, and eye exams.  Stay current with your immunizations.   Do not use any tobacco products including cigarettes, chewing tobacco, or electronic cigarettes.  If you are pregnant, do not drink alcohol.  If you are breastfeeding, limit how much and how often you drink alcohol.  Limit alcohol intake to no more than 1 drink per day for nonpregnant women. One drink equals 12 ounces of beer, 5 ounces of wine, or 1 ounces of hard liquor.  Do not use street drugs.  Do not share needles.  Ask your health care provider for help if  you need support or information about quitting drugs.  Tell your health care provider if you often feel depressed.  Tell your health care provider if you have ever been abused or do not feel safe at home.   This information is not intended to replace advice given to you by your health care provider. Make sure you discuss any questions you have with your health care provider.   Document Released: 01/28/2011 Document Revised: 08/05/2014 Document Reviewed: 06/16/2013 Elsevier Interactive Patient Education [employer].

## 2015-10-04 NOTE — Progress Notes (Signed)
Patient: Mary Hurst, Female    DOB: 1991/01/11, 25 y.o.   MRN: [id number] Visit Date: 10/04/2015  Today's Provider: Margaretann Loveless, PA-C   Chief Complaint  Patient presents with  . Annual Exam   Subjective:    Annual physical exam Mary Hurst is a 25 y.o. female who presents today for health maintenance and complete physical. She feels well. She reports exercising -daily-runs mostly every day and weight lifting. She reports she is sleeping well. She said she had he physical over at [facility] last year in the summer and reports her pap was normal. 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 She will get blood work done and any immunizations she needs by them.  Last PCP: 03/25/14 Pap: [id number] Normal G/C Negative Tdap: 11/08/13 -----------------------------------------------------------------   Review of Systems  Constitutional: Negative.   HENT: Negative.   Eyes: Negative.   Respiratory: Negative.   Cardiovascular: Negative.   Gastrointestinal: Negative.   Endocrine: Negative.   Genitourinary: Negative.   Musculoskeletal: Negative.   Skin: Negative.   Allergic/Immunologic: Negative.   Neurological: Negative.   Hematological: Negative.   Psychiatric/Behavioral: Negative.     Social History      She  reports that she has never smoked. She has never used smokeless tobacco. She reports that she drinks alcohol. She reports that she does not use illicit drugs.       Social History   Social History  . Marital Status: Single    Spouse Name: N/A  . Number of Children: N/A  . Years of Education: N/A   Social History Main Topics  . Smoking status: Never Smoker   . Smokeless tobacco: Never Used  . Alcohol Use: 0.0 oz/week    0 Standard drinks or equivalent per week     Comment: rare  . Drug Use: No  . Sexual Activity:    Partners: Male    Birth Control/  Protection: OCP   Other Topics Concern  . None   Social History Narrative    Past Medical History  Diagnosis Date  . Exercise-induced asthma   . Chicken pox   . Frequent headaches   . Vesicoureteral reflux      Patient Active Problem List   Diagnosis Date Noted  . Menorrhagia with irregular cycle 01/25/2015  . 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 Pelvic kidney 01/25/2015  . Seasonal allergies 12/19/2014  . Exercise-induced asthma 12/19/2014    History reviewed. No pertinent past surgical history.  Family History        Family Status  Relation Status Death Age  . Mother Alive   . Father Alive         Her family history includes Cancer in her maternal grandfather; Hypertension in her maternal grandmother; Stroke in her maternal grandmother.    No Known Allergies  Previous Medications   FLUTICASONE (FLONASE) 50 MCG/ACT NASAL SPRAY    Place 2 sprays into both nostrils daily.   LORATADINE (CLARITIN) 10 MG TABLET    Take 1 tablet (10 mg total) by mouth daily.   MONTELUKAST (SINGULAIR) 10 MG TABLET    Take 1 tablet (10 mg total) by mouth daily.   NORETHINDRONE ACETATE-ETHINYL ESTRADIOL (JUNEL 1.5/30) 1.5-30 MG-MCG TABLET    Take 1 tablet by mouth daily.    Patient Care Team: Margaretann Loveless, PA-C as PCP -  General (Family Medicine)     Objective:   Vitals: BP 120/60 mmHg  Pulse 80  Temp(Src) 97.7 F (36.5 C) (Oral)  Resp 16  Ht  (1.702 m)  Wt 146 lb 3.2 oz (66.316 kg)  BMI 22.89 kg/m2  SpO2 97%   Physical Exam  Constitutional: She is oriented to person, place, and time. She appears well-developed and well-nourished. No distress.  HENT:  Head: Normocephalic and atraumatic.  Right Ear: Hearing, tympanic membrane, external ear and ear canal normal.  Left Ear: Hearing, tympanic membrane, external ear and ear canal normal.  Nose: Nose normal.  Mouth/Throat: Uvula is midline, oropharynx is clear and moist and mucous membranes are normal. No oropharyngeal  exudate.  Eyes: Conjunctivae and EOM are normal. Pupils are equal, round, and reactive to light. Right eye exhibits no discharge. Left eye exhibits no discharge. No scleral icterus.  Neck: Normal range of motion. Neck supple. No JVD present. Carotid bruit is not present. No tracheal deviation present. No thyromegaly present.  Cardiovascular: Normal rate, regular rhythm, normal heart sounds and intact distal pulses.  Exam reveals no gallop and no friction rub.   No murmur heard. Pulmonary/Chest: Effort normal and breath sounds normal. No respiratory distress. She has no wheezes. She has no rales. She exhibits no tenderness. Right breast exhibits no inverted nipple, no mass, no nipple discharge, no skin change and no tenderness. Left breast exhibits no inverted nipple, no mass, no nipple discharge, no skin change and no tenderness. Breasts are symmetrical.  Abdominal: Soft. Bowel sounds are normal. She exhibits no distension and no mass. There is no tenderness. There is no rebound and no guarding. Hernia confirmed negative in the right inguinal area and confirmed negative in the left inguinal area.  Genitourinary: Rectum normal and uterus normal. No breast swelling, tenderness, discharge or bleeding. Pelvic exam was performed with patient supine. There is no rash, tenderness, lesion or injury on the right labia. There is no rash, tenderness, lesion or injury on the left labia. Cervix exhibits no motion tenderness, no discharge and no friability. Right adnexum displays no mass, no tenderness and no fullness. Left adnexum displays no mass, no tenderness and no fullness. No erythema, tenderness or bleeding in the vagina. No signs of injury around the vagina. Vaginal discharge found.  Musculoskeletal: Normal range of motion. She exhibits no edema or tenderness.  Lymphadenopathy:    She has no cervical adenopathy.       Right: No inguinal adenopathy present.       Left: No inguinal adenopathy present.    Neurological: She is alert and oriented to person, place, and time. She has normal reflexes. No cranial nerve deficit. Coordination normal.  Skin: Skin is warm and dry. No rash noted. She is not diaphoretic.  Psychiatric: She has a normal mood and affect. Her behavior is normal. Judgment and thought content normal.  Vitals reviewed.    Depression Screen PHQ 2/9 Scores 12/19/2014  PHQ - 2 Score 0      Assessment & Plan:     Routine Health Maintenance and Physical Exam  Exercise Activities and Dietary recommendations Goals    None      Immunization History  Administered Date(s) Administered  . Meningococcal Conjugate 11/08/2013  . Tdap 11/08/2013    Health Maintenance  Topic Date Due  . HIV Screening  08/04/2007  . INFLUENZA VACCINE  02/27/2015  . PAP SMEAR  03/25/2017  . TETANUS/TDAP  11/09/2023      Discussed health  benefits of physical activity, and encouraged her to engage in regular exercise appropriate for her age and condition.    --------------------------------------------------------------------

## 2015-10-06 ENCOUNTER — Telehealth: Payer: Self-pay

## 2015-10-06 LAB — PAP IG, CT-NG, RFX HPV ASCU
CHLAMYDIA, NUC. ACID AMP: NEGATIVE
GONOCOCCUS BY NUCLEIC ACID AMP: NEGATIVE
PAP Smear Comment: 0

## 2015-10-06 NOTE — Telephone Encounter (Signed)
-----   Message from Margaretann LovelessJennifer M Burnette, [place]PA-C sent at 10/06/2015  4:41 PM EST ----- Pap is normal.  GC/Chlamydia negative. Will repeat in 3 years.  Please print for patient for her records to be picked up with immunizations.  Thanks.

## 2015-10-06 NOTE — Telephone Encounter (Signed)
LMTCB. Copy of our immunization record and pap results will be placed up front for pick-up.  Thanks,  -Joseline

## 2015-10-11 NOTE — Telephone Encounter (Signed)
LMTCB  Thanks,  -Kaylon Hitz 

## 2015-10-11 NOTE — Telephone Encounter (Signed)
Patient advised as directed below.  Thanks,  -Joseline 

## 2015-11-02 ENCOUNTER — Telehealth: Payer: Self-pay | Admitting: Physician Assistant

## 2015-11-02 NOTE — Telephone Encounter (Signed)
Patient's mother wanted to know what was checked on patient's pap smear. She reports that patient tested positive for a STD from a specimen that was collected on 10/17/15 at her boot [employer]. Patient also had a pap smear done here in the office which was negative on 10/04/15. She reports that patient has not had any sexual contact since 09/11/15 due to her fiance being deployed. She is wanting patient to be checked again to make sure that the specimen that was collected at boot camp was false positive. Scheduled patient to come in the office to be rechecked for STDs on 12/11/15.

## 2015-11-02 NOTE — Telephone Encounter (Signed)
Pt's mom (on DPR) would like to speak with a nurse about pt's last PAP results. Mom stated she has some questions should would like to ask the nurse. Mom said she would explain when the nurse returns her call. Thanks TNP

## 2015-12-11 ENCOUNTER — Encounter: Payer: Self-pay | Admitting: Physician Assistant

## 2015-12-11 ENCOUNTER — Ambulatory Visit (INDEPENDENT_AMBULATORY_CARE_PROVIDER_SITE_OTHER): Payer: BC Managed Care – PPO | Admitting: Physician Assistant

## 2015-12-11 VITALS — BP 112/72 | HR 84 | Temp 98.0°F | Resp 16 | Wt 159.8 lb

## 2015-12-11 DIAGNOSIS — E611 Iron deficiency: Secondary | ICD-10-CM | POA: Diagnosis not present

## 2015-12-11 DIAGNOSIS — J0100 Acute maxillary sinusitis, unspecified: Secondary | ICD-10-CM

## 2015-12-11 DIAGNOSIS — R609 Edema, unspecified: Secondary | ICD-10-CM | POA: Diagnosis not present

## 2015-12-11 DIAGNOSIS — B977 Papillomavirus as the cause of diseases classified elsewhere: Secondary | ICD-10-CM | POA: Diagnosis not present

## 2015-12-11 DIAGNOSIS — R888 Abnormal findings in other body fluids and substances: Secondary | ICD-10-CM

## 2015-12-11 DIAGNOSIS — M25572 Pain in left ankle and joints of left foot: Secondary | ICD-10-CM | POA: Diagnosis not present

## 2015-12-11 DIAGNOSIS — M940 Chondrocostal junction syndrome [Tietze]: Secondary | ICD-10-CM

## 2015-12-11 DIAGNOSIS — H6501 Acute serous otitis media, right ear: Secondary | ICD-10-CM | POA: Diagnosis not present

## 2015-12-11 DIAGNOSIS — IMO0002 Reserved for concepts with insufficient information to code with codable children: Secondary | ICD-10-CM | POA: Insufficient documentation

## 2015-12-11 MED ORDER — MELOXICAM 15 MG PO TABS: 15.0000 mg | ORAL_TABLET | Freq: Every day | ORAL | Status: DC

## 2015-12-11 MED ORDER — AMOXICILLIN-POT CLAVULANATE 875-125 MG PO TABS: 1.0000 | ORAL_TABLET | Freq: Two times a day (BID) | ORAL | Status: DC

## 2015-12-11 NOTE — Progress Notes (Signed)
Patient: Mary Hurst Female    DOB: 1991-01-26   25 y.o.   MRN: [id number] Visit Date: 12/11/2015  Today's Provider: Margaretann Loveless, PA-C   Chief Complaint  Patient presents with  . STD's Screening   Subjective:    HPI  Mary Hurst is here today to discuss abnormal pap test that was done at basic training for [employer].  She was seen 03/10 and we tested for GC/ Chlamydia and results were negative. Pap was normal. 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 Advised to be rechecked in one year.   She also is concerned of swelling and feeling of putting on water weight. She states that since last week she has went from 152 pounds to 159 pounds today. She reports normal urine that is clear. She also was told she had low iron when she went to donate blood. She recently completed boot camp for the [employer] and returned home this past Friday (12/08/15). She states boot camp was very intense and strenuous exercise from 8am until 10pm daily.   She also has complaints of rib pain on the right side for which she has been evaluated for and was told it was costochondritis. She has complaints of left ankle pain as well that is very tender to touch. No known injury. No discoloration. Mild edema. She has not been taking anything for either. She has not had any imaging. She has been trying conservative management with epsom salt soaks in warm water and hot tubs.      No Known Allergies Previous Medications   FLUTICASONE (FLONASE) 50 MCG/ACT NASAL SPRAY    Place 2 sprays into both nostrils daily.   LORATADINE (CLARITIN) 10 MG TABLET    Take 1 tablet (10 mg total) by mouth daily.   MONTELUKAST (SINGULAIR) 10 MG TABLET    Take 1 tablet (10 mg total) by mouth daily.   NORETHINDRONE ACETATE-ETHINYL ESTRADIOL (JUNEL 1.5/30) 1.5-30 MG-MCG TABLET    Take 1 tablet by mouth daily.    Review of  Systems  Constitutional: Positive for fatigue and unexpected weight change. Negative for fever and chills.  HENT: Positive for congestion, ear pain (right), postnasal drip, rhinorrhea, sinus pressure, sneezing and sore throat (right). Negative for tinnitus and trouble swallowing.   Respiratory: Positive for cough. Negative for chest tightness, shortness of breath and wheezing.   Cardiovascular: Positive for chest pain (costochondritis right side jsut under breast probably 5th-6th rib) and leg swelling. Negative for palpitations.  Gastrointestinal: Negative for nausea, vomiting, abdominal pain, diarrhea and constipation.  Genitourinary: Negative.   Musculoskeletal: Positive for myalgias, joint swelling (left ankle) and arthralgias (left ankle). Negative for back pain, gait problem, neck pain and neck stiffness.  Skin: Negative.   Allergic/Immunologic: Positive for environmental allergies.  Neurological: Negative.   Hematological: Negative.   Psychiatric/Behavioral: Negative.     Social History  Substance Use Topics  . Smoking status: Never Smoker   . Smokeless tobacco: Never Used  . Alcohol Use: 0.0 oz/week    0 Standard drinks or equivalent per week     Comment: rare   Objective:   BP 112/72 mmHg  Pulse 84  Temp(Src) 98 F (36.7 C) (Oral)  Resp 16  Wt 159 lb 12.8 oz (72.485 kg)  Physical Exam  Constitutional: She appears well-developed and well-nourished. No distress.  HENT:  Head: Normocephalic  and atraumatic.  Right Ear: Hearing, external ear and ear canal normal. Tympanic membrane is erythematous. Tympanic membrane is not bulging. A middle ear effusion is present.  Left Ear: Hearing, tympanic membrane, external ear and ear canal normal.  Nose: Mucosal edema and rhinorrhea present. Right sinus exhibits maxillary sinus tenderness. Right sinus exhibits no frontal sinus tenderness. Left sinus exhibits maxillary sinus tenderness. Left sinus exhibits no frontal sinus tenderness.    Mouth/Throat: Uvula is midline and mucous membranes are normal. Posterior oropharyngeal erythema (right side and right tonsil slightly enlarged) present. No oropharyngeal exudate or posterior oropharyngeal edema.  Eyes: Conjunctivae are normal. Pupils are equal, round, and reactive to light. Right eye exhibits no discharge. Left eye exhibits no discharge. No scleral icterus.  Neck: Normal range of motion. Neck supple. No tracheal deviation present. No thyromegaly present.  Cardiovascular: Normal rate, regular rhythm and normal heart sounds.  Exam reveals no gallop and no friction rub.   No murmur heard. Pulmonary/Chest: Effort normal and breath sounds normal. No stridor. No respiratory distress. She has no wheezes. She has no rales. She exhibits tenderness and bony tenderness. She exhibits no mass, no laceration, no crepitus, no edema, no deformity and no retraction.    Musculoskeletal: She exhibits no edema.       Right ankle: Normal.       Left ankle: She exhibits swelling. She exhibits normal range of motion, no ecchymosis, no deformity, no laceration and normal pulse. Tenderness. Lateral malleolus, AITFL, CF ligament and posterior TFL tenderness found. Achilles tendon normal.  Lymphadenopathy:    She has no cervical adenopathy.  Skin: Skin is warm and dry. She is not diaphoretic.  Vitals reviewed.       Assessment & Plan:     1. Acute maxillary sinusitis, recurrence not specified Worsening symptoms for 2-3 weeks. Will give augmenitn as below. Continue allergy medications. Call if symptoms worsen or fail to improve. - amoxicillin-clavulanate (AUGMENTIN) 875-125 MG tablet; Take 1 tablet by mouth 2 (two) times daily.  Dispense: 20 tablet; Refill: 0  2. Right acute serous otitis media, recurrence not specified See above medical treatment plan. - amoxicillin-clavulanate (AUGMENTIN) 875-125 MG tablet; Take 1 tablet by mouth 2 (two) times daily.  Dispense: 20 tablet; Refill: 0  3.  Swelling Will check labs as below to R/O slow onset rhabdomyolysis. If kidney function and CK ok may consider low dose lasix for short term. Will f/u pending lab results. - Comprehensive Metabolic Panel (CMET) - CBC with Differential - CK (Creatine Kinase)  4. Costochondritis Will give meloxicam for anti-inflammatory. May use tylenol between doses. Heating pad to area. Discussed long time to heal. Call if symptoms worsen. - meloxicam (MOBIC) 15 MG tablet; Take 1 tablet (15 mg total) by mouth daily.  Dispense: 30 tablet; Refill: 0  5. Iron deficiency Will check labs and f/u pending labs since was told iron to low to donate blood. - Ferritin  6. Left ankle pain Will give meloxicam and check labs. If labs ok will get xray to R/O stress fracture. F/u pending results.  7. HPV test positive Pap was also collected at basic training for [employer] and HPV testing was done. She did test HPV positive but no dysplasia. Will retest in one year.       Margaretann LovelessJennifer M Burnette, PA-C  [facility]

## 2015-12-12 ENCOUNTER — Telehealth: Payer: Self-pay

## 2015-12-12 ENCOUNTER — Telehealth: Payer: Self-pay | Admitting: Physician Assistant

## 2015-12-12 LAB — COMPREHENSIVE METABOLIC PANEL WITH GFR
ALT: 18 [IU]/L (ref 0–32)
AST: 26 [IU]/L (ref 0–40)
Albumin/Globulin Ratio: 1.3 (ref 1.2–2.2)
Albumin: 3.8 g/dL (ref 3.5–5.5)
Alkaline Phosphatase: 52 [IU]/L (ref 39–117)
BUN/Creatinine Ratio: 16 (ref 9–23)
BUN: 14 mg/dL (ref 6–20)
Bilirubin Total: <0.2 mg/dL (ref 0.0–1.2)
CO2: 24 mmol/L (ref 18–29)
Calcium: 8.8 mg/dL (ref 8.7–10.2)
Chloride: 100 mmol/L (ref 96–106)
Creatinine, Ser: 0.88 mg/dL (ref 0.57–1.00)
GFR calc Af Amer: 107 mL/min/{1.73_m2}
GFR calc non Af Amer: 93 mL/min/{1.73_m2}
Globulin, Total: 2.9 g/dL (ref 1.5–4.5)
Glucose: 83 mg/dL (ref 65–99)
Potassium: 4.1 mmol/L (ref 3.5–5.2)
Sodium: 139 mmol/L (ref 134–144)
Total Protein: 6.7 g/dL (ref 6.0–8.5)

## 2015-12-12 LAB — CBC WITH DIFFERENTIAL/PLATELET
Basophils Absolute: 0 10*3/uL (ref 0.0–0.2)
Basos: 0 %
EOS (ABSOLUTE): 0.1 10*3/uL (ref 0.0–0.4)
Eos: 1 %
Hematocrit: 39.3 % (ref 34.0–46.6)
Hemoglobin: 13 g/dL (ref 11.1–15.9)
Immature Grans (Abs): 0 10*3/uL (ref 0.0–0.1)
Immature Granulocytes: 0 %
Lymphocytes Absolute: 1.7 10*3/uL (ref 0.7–3.1)
Lymphs: 17 %
MCH: 31.2 pg (ref 26.6–33.0)
MCHC: 33.1 g/dL (ref 31.5–35.7)
MCV: 94 fL (ref 79–97)
Monocytes Absolute: 0.6 10*3/uL (ref 0.1–0.9)
Monocytes: 6 %
Neutrophils Absolute: 7.6 10*3/uL — ABNORMAL HIGH (ref 1.4–7.0)
Neutrophils: 76 %
Platelets: 270 10*3/uL (ref 150–379)
RBC: 4.17 x10E6/uL (ref 3.77–5.28)
RDW: 13.6 % (ref 12.3–15.4)
WBC: 10.1 10*3/uL (ref 3.4–10.8)

## 2015-12-12 LAB — CK: Total CK: 300 U/L — ABNORMAL HIGH (ref 24–173)

## 2015-12-12 LAB — FERRITIN: FERRITIN: 12 ng/mL — AB (ref 15–150)

## 2015-12-12 MED ORDER — FUROSEMIDE 20 MG PO TABS: 20.0000 mg | ORAL_TABLET | Freq: Every day | ORAL | Status: DC | PRN

## 2015-12-12 NOTE — Telephone Encounter (Signed)
Do you know if we have labs yet.   Thanks,  -Yerick Eggebrecht

## 2015-12-12 NOTE — Telephone Encounter (Signed)
I just got them and sent you a note :)

## 2015-12-12 NOTE — Telephone Encounter (Signed)
-----   Message from Margaretann LovelessJennifer M Burnette, [place] sent at 12/12/2015 10:42 AM EDT ----- Labs were fairly normal with exception of iron and CK. Kidney and liver function were normal. Iron was borderline low at 12 (low normal is 15). May benefit from ferrous sulfate 325mg  BID. Iron supplement may cause GI upset and darken stools. Take with food. CK was also elevated slightly but not to levels of rhabdomyolysis. This is most likely associated with increased exercise. Will add furosemide (lasix) for a short period to see if this helps with swelling and muscle ache. Let me know if you want xray of ankle also.

## 2015-12-12 NOTE — Telephone Encounter (Signed)
Yes the CK could cause some slight edema but it is not severe enough to be damaging the kidneys. Continue to push fluids and if noticing darkening of urine she needs to come back to be seen immediately.

## 2015-12-12 NOTE — Telephone Encounter (Signed)
Patient advised as directed below.She wants to know that if the CK is slightly elevated, is this the reason why she is getting some swelling. She wants to wait on the Xray until [place] gets to [place]. Is not sure if they will run an xray [place] the base, if not and she doesn't see any changes she will call to let us know. Spoke also with Angie patient's mother and gave the results.  Thanks,  -Kylina Vultaggio

## 2015-12-12 NOTE — Telephone Encounter (Signed)
Patient's mother advised as directed below.  Thanks,  -Joseline 

## 2015-12-12 NOTE — Telephone Encounter (Signed)
Pt mom, Angie called for lab results.  CB#[phone]/MW

## 2015-12-12 NOTE — Addendum Note (Signed)
Addended by: Margaretann LovelessBURNETTE, JENNIFER M on: 12/12/2015 10:44 AM   Modules accepted: Orders

## 2015-12-18 ENCOUNTER — Telehealth: Payer: Self-pay | Admitting: Physician Assistant

## 2015-12-18 DIAGNOSIS — J0140 Acute pansinusitis, unspecified: Secondary | ICD-10-CM

## 2015-12-18 MED ORDER — DOXYCYCLINE HYCLATE 100 MG PO TABS: 100.0000 mg | ORAL_TABLET | Freq: Two times a day (BID) | ORAL | Status: DC

## 2015-12-18 NOTE — Telephone Encounter (Signed)
Pt states she seen Antony ContrasJenni last week for sinus pressure and congestion. Pt states she is still having sinus pressure and congestion.  Pt is requesting a different Rx to help with this.  [facility] [address]  [phone]#[phone]/MW

## 2015-12-18 NOTE — Telephone Encounter (Signed)
[facility] [address]

## 2015-12-18 NOTE — Telephone Encounter (Signed)
Will you authorize a new Rx.  Thanks

## 2015-12-18 NOTE — Telephone Encounter (Signed)
Advised  ED 

## 2015-12-22 ENCOUNTER — Telehealth: Payer: Self-pay

## 2015-12-22 DIAGNOSIS — J0141 Acute recurrent pansinusitis: Secondary | ICD-10-CM

## 2015-12-22 MED ORDER — AZITHROMYCIN 250 MG PO TABS: ORAL_TABLET | ORAL | Status: DC

## 2015-12-22 NOTE — Telephone Encounter (Signed)
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 She states that she has seen you for this and switched antibiotics and states Doxy is not really working either. She saying that Zpack helped her the most before and wanted to know if she can try that.  Or does she need to not take any medications and giver her body a break from it. She states she would like [facility] possible please instead of the nurse about this.-aa

## 2015-12-22 NOTE — Telephone Encounter (Signed)
Called and left VM. Please get me if she calls back. Thanks.

## 2015-12-22 NOTE — Telephone Encounter (Signed)
Zpak sent in. 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

## 2016-02-04 ENCOUNTER — Other Ambulatory Visit: Payer: Self-pay | Admitting: Physician Assistant

## 2016-05-06 ENCOUNTER — Telehealth: Payer: Self-pay | Admitting: Physician Assistant

## 2016-05-06 NOTE — Telephone Encounter (Signed)
Mom returned Rachelle's call.  Please call [phone].  Thank sTeri

## 2016-05-06 NOTE — Telephone Encounter (Signed)
Already spoke with patient's mother and she is advised that the form is ready for pick up and with a copy of her immunization.  Thanks,  -Joselie

## 2016-06-28 ENCOUNTER — Ambulatory Visit (INDEPENDENT_AMBULATORY_CARE_PROVIDER_SITE_OTHER): Admitting: Physician Assistant

## 2016-06-28 ENCOUNTER — Encounter: Payer: Self-pay | Admitting: Physician Assistant

## 2016-06-28 VITALS — BP 120/78 | HR 85 | Temp 98.3°F | Resp 16 | Wt 152.4 lb

## 2016-06-28 DIAGNOSIS — J0140 Acute pansinusitis, unspecified: Secondary | ICD-10-CM | POA: Diagnosis not present

## 2016-06-28 DIAGNOSIS — J301 Allergic rhinitis due to pollen: Secondary | ICD-10-CM | POA: Diagnosis not present

## 2016-06-28 MED ORDER — LORATADINE-PSEUDOEPHEDRINE ER 5-120 MG PO TB12: 1.0000 | ORAL_TABLET | Freq: Two times a day (BID) | ORAL | 5 refills | Status: DC

## 2016-06-28 MED ORDER — FLUTICASONE PROPIONATE 50 MCG/ACT NA SUSP: 2.0000 | Freq: Every day | NASAL | 12 refills | Status: DC

## 2016-06-28 MED ORDER — AMOXICILLIN-POT CLAVULANATE 875-125 MG PO TABS: 1.0000 | ORAL_TABLET | Freq: Two times a day (BID) | ORAL | 0 refills | Status: DC

## 2016-06-28 NOTE — Progress Notes (Signed)
Patient: Mary Hurst Female    DOB: 03/20/1991   25 y.o.   MRN: [id number] Visit Date: 06/28/2016  Today's Provider: Margaretann LovelessJennifer M Thane Age, PA-C   Chief Complaint  Patient presents with  . Sinusitis   Subjective:    URI   This is a recurrent problem. The current episode started in the past 7 days (started on Sunday). The problem has been gradually worsening. There has been no fever. Associated symptoms include congestion, coughing, ear pain, headaches, a plugged ear sensation, rhinorrhea, sinus pain, sneezing, a sore throat and swollen glands. Pertinent negatives include no abdominal pain, chest pain, vomiting or wheezing. She has tried acetaminophen and decongestant (She reports that she still had the Zpak  from last time and she started to take it on Tuesday and last dose is until tomorrow. Mucinex cold and Flu) for the symptoms. The treatment provided no relief.   She has had multiple recurrent sinus infections. She underwent a CT of the sinuses in [place] when she was at school and they had mentioned possible sinus surgery. She was started on Alavert D and improved some while she was in [place]. She just returned home the week before Thanksgiving and started having sinus issues again. She does not have any more alavert-D and is requesting a Rx for this.    No Known Allergies   Current Outpatient Prescriptions:  .  azithromycin (ZITHROMAX) 250 MG tablet, Take 2 tablets PO on day one, and one tablet PO daily thereafter until completed., Disp: 6 tablet, Rfl: 0 .  fluticasone (FLONASE) 50 MCG/ACT nasal spray, Place 2 sprays into both nostrils daily., Disp: 16 g, Rfl: 12 .  Norethindrone Acetate-Ethinyl Estradiol (JUNEL 1.5/30) 1.5-30 MG-MCG tablet, Take 1 tablet by mouth daily., Disp: 3 Package, Rfl: 3 .  loratadine (CLARITIN) 10 MG tablet, Take 1 tablet (10 mg total) by mouth daily. (Patient not taking: Reported on 06/28/2016), Disp: 30 tablet, Rfl: 12 .  montelukast  (SINGULAIR) 10 MG tablet, Take 1 tablet (10 mg total) by mouth daily. (Patient not taking: Reported on 06/28/2016), Disp: 30 tablet, Rfl: 12  Review of Systems  Constitutional: Positive for fatigue. Negative for chills and fever.  HENT: Positive for congestion, ear pain, postnasal drip, rhinorrhea, sinus pain, sinus pressure, sneezing and sore throat. Negative for trouble swallowing.   Eyes: Negative.   Respiratory: Positive for cough and chest tightness. Negative for shortness of breath and wheezing.   Cardiovascular: Negative for chest pain, palpitations and leg swelling.  Gastrointestinal: Negative for abdominal pain and vomiting.  Neurological: Positive for headaches. Negative for dizziness.    Social History  Substance Use Topics  . Smoking status: Never Smoker  . Smokeless tobacco: Never Used  . Alcohol use 0.0 oz/week     Comment: rare   Objective:   BP 120/78 (BP Location: Right Arm, Patient Position: Sitting, Cuff Size: Normal)   Pulse 85   Temp 98.3 F (36.8 C) (Oral)   Resp 16   Wt 152 lb 6.4 oz (69.1 kg)   SpO2 99%   BMI 23.87 kg/m   Physical Exam  Constitutional: She appears well-developed and well-nourished. No distress.  HENT:  Head: Normocephalic and atraumatic.  Right Ear: Hearing, tympanic membrane, external ear and ear canal normal.  Left Ear: Hearing, tympanic membrane, external ear and ear canal normal.  Nose: Mucosal edema and rhinorrhea present. Right sinus exhibits maxillary sinus tenderness and frontal sinus tenderness. Left sinus exhibits maxillary sinus tenderness and  frontal sinus tenderness.  Mouth/Throat: Uvula is midline and mucous membranes are normal. Posterior oropharyngeal erythema (cobblestone appearance) present. No oropharyngeal exudate or posterior oropharyngeal edema.  Neck: Normal range of motion. Neck supple. No tracheal deviation present. No thyromegaly present.  Cardiovascular: Normal rate, regular rhythm and normal heart sounds.  Exam  reveals no gallop and no friction rub.   No murmur heard. Pulmonary/Chest: Effort normal and breath sounds normal. No stridor. No respiratory distress. She has no wheezes. She has no rales.  Lymphadenopathy:    She has cervical adenopathy.  Skin: She is not diaphoretic.  Vitals reviewed.      Assessment & Plan:     1. Acute pansinusitis, recurrence not specified Worsening symptoms that have not responded to OTC medications. Will give augmentin as below. Continue allergy medications. Stay well hydrated and get plenty of rest. Call if no symptom improvement or if symptoms worsen. - amoxicillin-clavulanate (AUGMENTIN) 875-125 MG tablet; Take 1 tablet by mouth 2 (two) times daily.  Dispense: 20 tablet; Refill: 0  2. Chronic seasonal allergic rhinitis due to pollen Stable. Diagnosis pulled for medication refill. Continue current medical treatment plan. - fluticasone (FLONASE) 50 MCG/ACT nasal spray; Place 2 sprays into both nostrils daily.  Dispense: 16 g; Refill: 12 - loratadine-pseudoephedrine (CLARITIN-D 12 HOUR) 5-120 MG tablet; Take 1 tablet by mouth 2 (two) times daily.  Dispense: 60 tablet; Refill: 5       Margaretann LovelessJennifer M Jenner Rosier, PA-C  [facility]

## 2016-06-28 NOTE — Patient Instructions (Signed)

## 2016-07-25 ENCOUNTER — Telehealth: Payer: Self-pay

## 2016-07-25 DIAGNOSIS — Z3041 Encounter for surveillance of contraceptive pills: Secondary | ICD-10-CM

## 2016-07-25 MED ORDER — NORETHINDRONE ACET-ETHINYL EST 1.5-30 MG-MCG PO TABS: 1.0000 | ORAL_TABLET | Freq: Every day | ORAL | 3 refills | Status: AC

## 2016-07-25 NOTE — Telephone Encounter (Signed)
[facility]

## 2016-07-25 NOTE — Telephone Encounter (Signed)
Patient called asking for refill on Junel before she moves up to [place] please. CB [phone]. [facility] [address]

## 2017-02-03 IMAGING — US US PELVIS COMPLETE
1 series · 13 of 25 positions shown · non-contrast
Comparison: None

CLINICAL DATA: Dysfunctional uterine bleeding



[Series 1: us pelvis complete · 0.22mm/px · 13 of 127 slices shown]
[im 1/127]
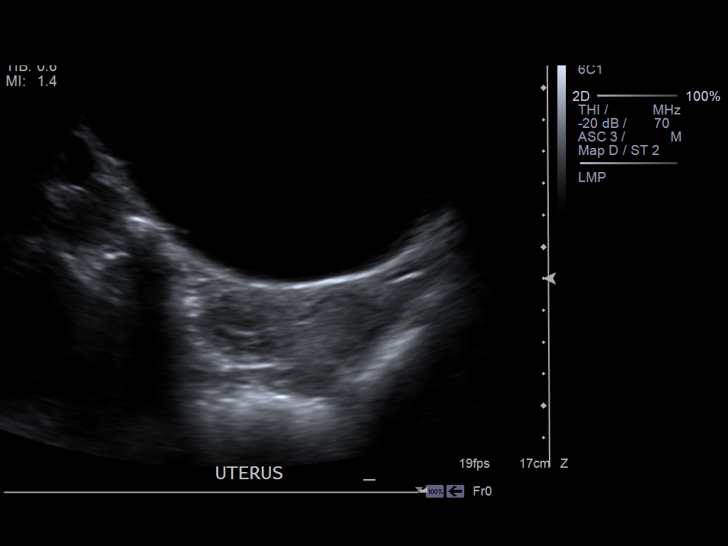
[im 11/127]
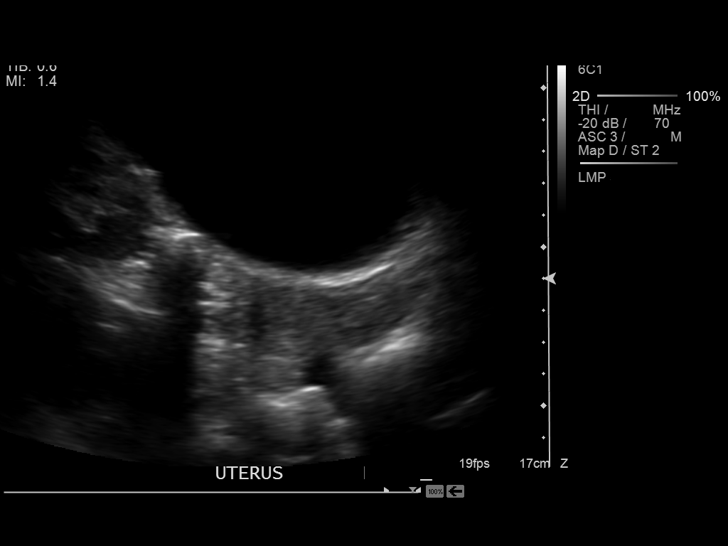
[im 22/127]
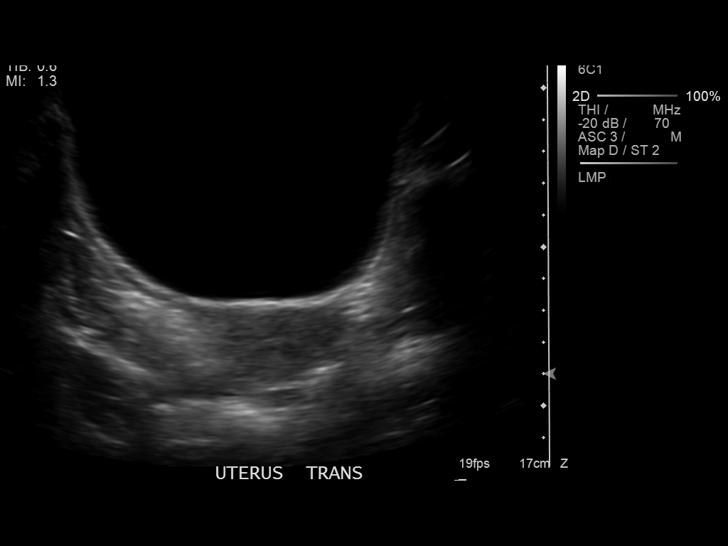
[im 32/127]
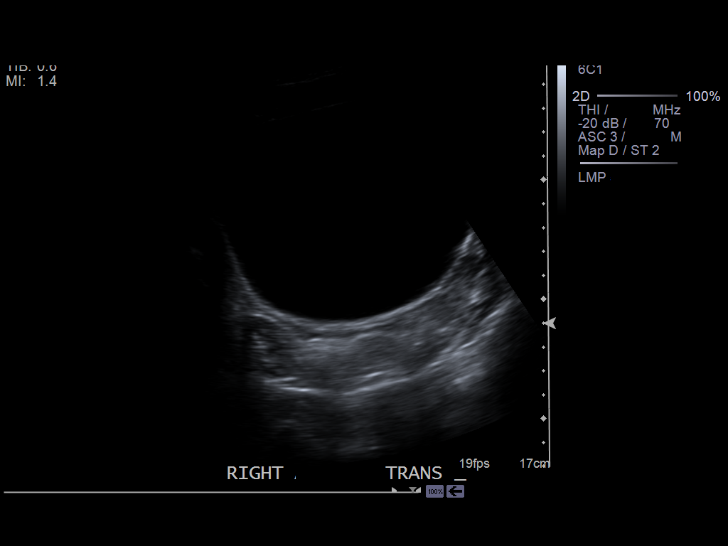
[im 43/127]
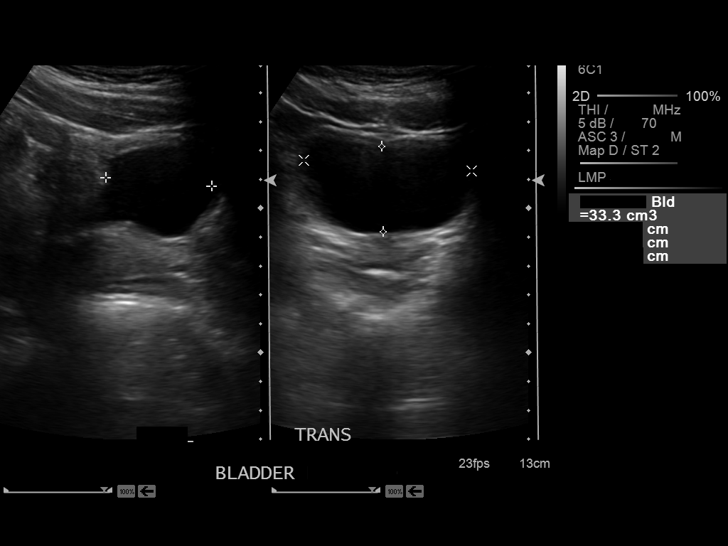
[im 53/127]
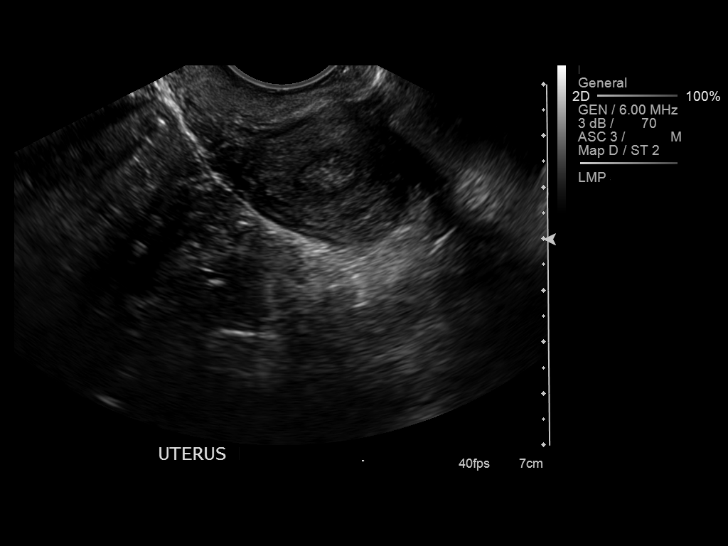
[im 64/127]
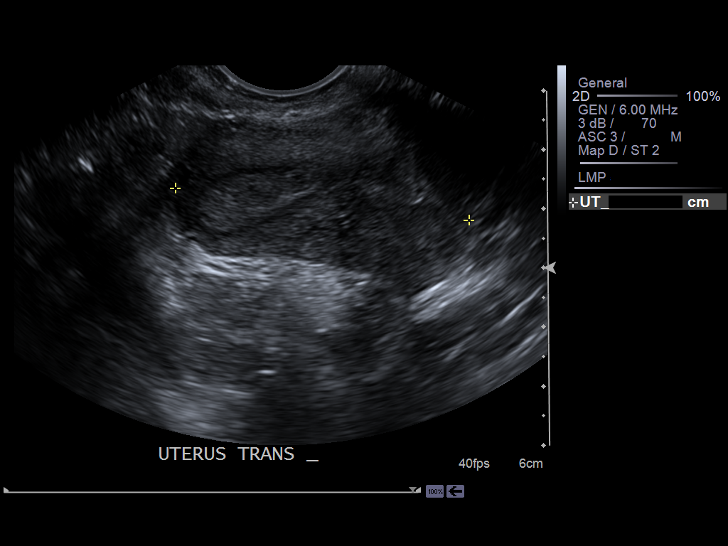
[im 74/127]
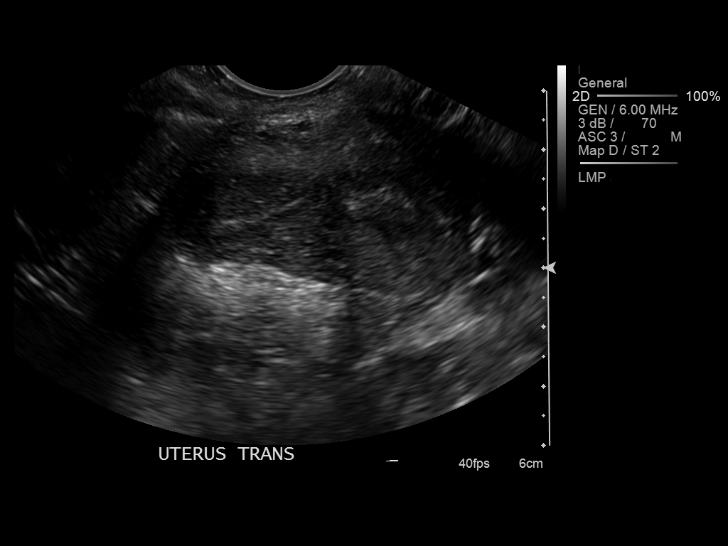
[im 85/127]
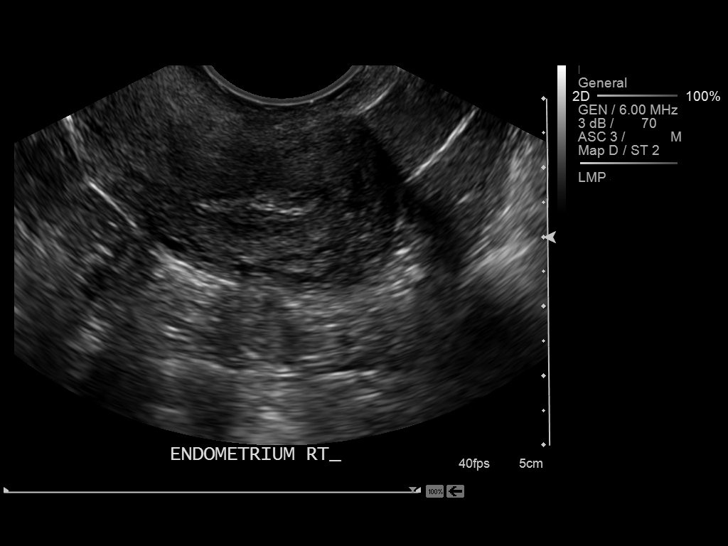
[im 95/127]
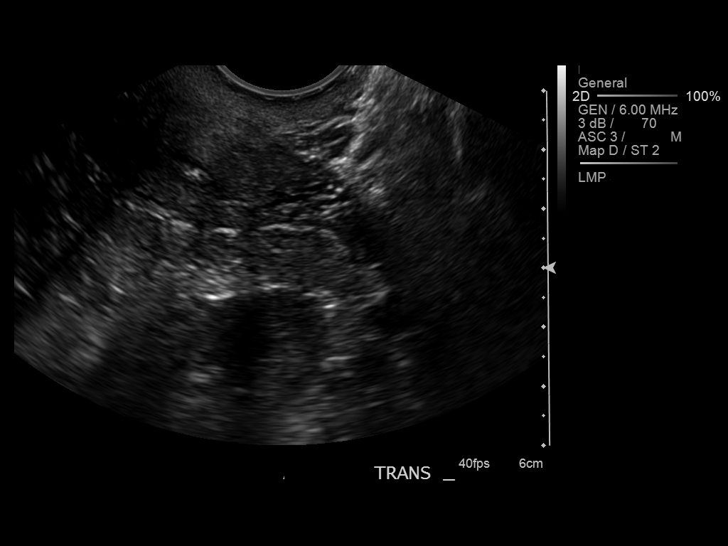
[im 106/127]
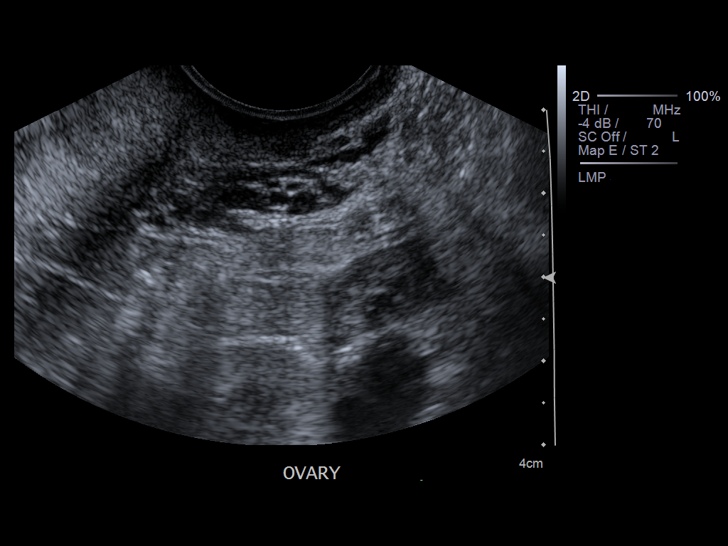
[im 116/127]
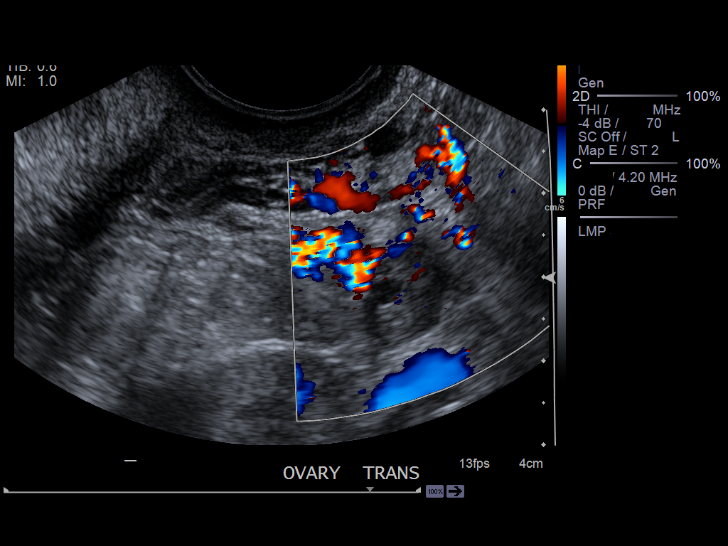
[im 127/127]
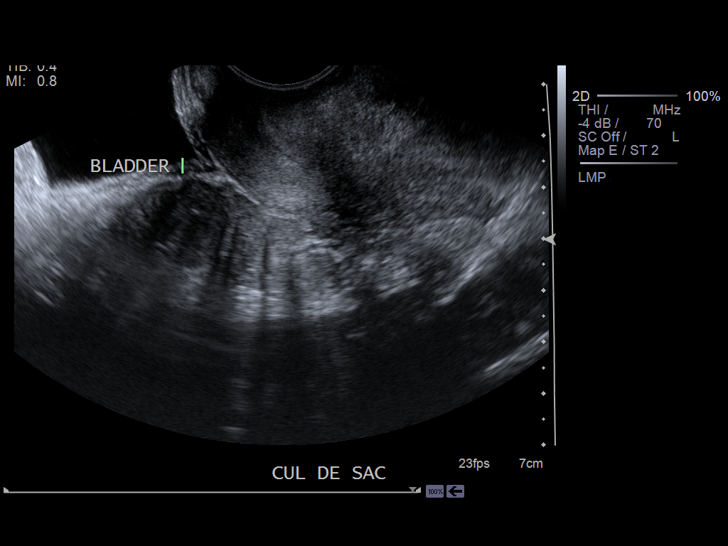

[13 of 25 positions shown; findings below may reference images not displayed]

FINDINGS: Uterus

Measurements: 6.4 x 2.4 x 4.9 cm. Divergent endometrial canal saw
are identified consistent with an arcuate uterus. The uterus appears
retroverted.

Endometrium

Thickness: On the right the endometrium measures 4.7 mm. On the left
the endometrium measures 4.3 mm. No focal abnormality visualized.

Right ovary

Measurements: Not visualize.. Normal appearance/no adnexal mass.

Left ovary

Measurements: Normal measuring 1.9 x 0.9 x 1.1 cm. Normal
appearance/no adnexal mass.

Other findings

No free fluid.
IMPRESSION: 1. Arcuate uterus.  No endometrial abnormality identified.
2. If bleeding remains unresponsive to hormonal or medical therapy,
sonohysterogram should be considered for focal lesion work-up. (Ref:
Radiological Reasoning: Algorithmic Workup of Abnormal Vaginal
Bleeding with Endovaginal Sonography and Sonohysterography. AJR
[id number]; 191:S68-73)

## 2018-01-23 ENCOUNTER — Encounter: Payer: Self-pay | Admitting: Physician Assistant

## 2018-01-23 ENCOUNTER — Ambulatory Visit (INDEPENDENT_AMBULATORY_CARE_PROVIDER_SITE_OTHER): Admitting: Physician Assistant

## 2018-01-23 VITALS — BP 110/80 | HR 117 | Temp 98.5°F | Resp 20 | Wt 151.0 lb

## 2018-01-23 DIAGNOSIS — J301 Allergic rhinitis due to pollen: Secondary | ICD-10-CM | POA: Diagnosis not present

## 2018-01-23 DIAGNOSIS — B379 Candidiasis, unspecified: Secondary | ICD-10-CM | POA: Diagnosis not present

## 2018-01-23 DIAGNOSIS — T3695XA Adverse effect of unspecified systemic antibiotic, initial encounter: Secondary | ICD-10-CM

## 2018-01-23 DIAGNOSIS — J0140 Acute pansinusitis, unspecified: Secondary | ICD-10-CM

## 2018-01-23 MED ORDER — AMOXICILLIN-POT CLAVULANATE 875-125 MG PO TABS: 1.0000 | ORAL_TABLET | Freq: Two times a day (BID) | ORAL | 0 refills | Status: AC

## 2018-01-23 MED ORDER — FLUTICASONE FUROATE 27.5 MCG/SPRAY NA SUSP: 2.0000 | Freq: Every day | NASAL | 12 refills | Status: AC

## 2018-01-23 MED ORDER — FLUCONAZOLE 150 MG PO TABS: 150.0000 mg | ORAL_TABLET | Freq: Once | ORAL | 0 refills | Status: AC

## 2018-01-23 MED ORDER — CETIRIZINE-PSEUDOEPHEDRINE ER 5-120 MG PO TB12: 1.0000 | ORAL_TABLET | Freq: Two times a day (BID) | ORAL | 0 refills | Status: AC

## 2018-01-23 NOTE — Progress Notes (Signed)
Patient: Mary Hurst Female    DOB: 1991/07/06   27 y.o.   MRM: [id number] Visit Date: 01/23/2018  Today's Provider: Margaretann Loveless, PA-C   Chief Complaint  Patient presents with  . Sore Throat   Subjective:    HPI Patient here today C/O sinus pressure, headache, x 2 weeks. Patient reports that last night she had a fever of 101, patient last took Tylenol 500 mg this morning. Patient reports sore throat, chills and ear pain today. Patient denies any discharge from ears. Patient denies any sick contacts, patient reports she did have a lot of strep as a young child. Patient reports she did travel and was at an airport so thinks she may have caught something there. 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 Patient reports she is getting married in about a week and half so is trying to rule out as much as possible.    No Known Allergies   Current Outpatient Medications:  .  Cetirizine HCl (ZYRTEC PO), Take by mouth., Disp: , Rfl:  .  Norethindrone Acetate-Ethinyl Estradiol (JUNEL 1.5/30) 1.5-30 MG-MCG tablet, Take 1 tablet by mouth daily., Disp: 3 Package, Rfl: 3  Review of Systems  Constitutional: Negative.   HENT: Positive for congestion, ear pain, postnasal drip, rhinorrhea, sinus pain and sore throat.   Respiratory: Negative.   Cardiovascular: Negative.   Neurological: Positive for headaches.    Social History   Tobacco Use  . Smoking status: Never Smoker  . Smokeless tobacco: Never Used  Substance Use Topics  . Alcohol use: Yes    Alcohol/week: 0.0 oz    Comment: rare   Objective:   BP 110/80 (BP Location: Right Arm, Patient Position: Sitting, Cuff Size: Normal)   Pulse (!) 117   Temp 98.5 F (36.9 C) (Oral)   Resp 20   Wt 151 lb (68.5 kg)   SpO2 99%   BMI 23.65 kg/m  Vitals:   01/23/18 1047  BP: 110/80  Pulse: (!) 117  Resp: 20  Temp: 98.5 F (36.9 C)  TempSrc: Oral  SpO2: 99%  Weight: 151 lb  (68.5 kg)     Physical Exam  Constitutional: She appears well-developed and well-nourished. No distress.  HENT:  Head: Normocephalic and atraumatic.  Right Ear: Hearing, tympanic membrane, external ear and ear canal normal.  Left Ear: Hearing, tympanic membrane, external ear and ear canal normal.  Nose: Right sinus exhibits maxillary sinus tenderness and frontal sinus tenderness. Left sinus exhibits maxillary sinus tenderness and frontal sinus tenderness.  Mouth/Throat: Uvula is midline, oropharynx is clear and moist and mucous membranes are normal. No oropharyngeal exudate.  Neck: Normal range of motion. Neck supple. No tracheal deviation present. No thyromegaly present.  Cardiovascular: Normal rate, regular rhythm and normal heart sounds. Exam reveals no gallop and no friction rub.  No murmur heard. Pulmonary/Chest: Effort normal and breath sounds normal. No stridor. No respiratory distress. She has no wheezes. She has no rales.  Lymphadenopathy:    She has no cervical adenopathy.  Skin: She is not diaphoretic.  Vitals reviewed.       Assessment & Plan:     1. Acute pansinusitis, recurrence not specified Worsening symptoms that have not responded to OTC medications. Will give augmentin as below. Continue allergy medications. Stay well hydrated and get plenty of rest. Call if no symptom improvement or if symptoms worsen. - amoxicillin-clavulanate (AUGMENTIN) 875-125 MG  tablet; Take 1 tablet by mouth 2 (two) times daily.  Dispense: 20 tablet; Refill: 0  2. Non-seasonal allergic rhinitis due to pollen Stable. Diagnosis pulled for medication refill. Continue current medical treatment plan. - fluticasone (FLONASE SENSIMIST) 27.5 MCG/SPRAY nasal spray; Place 2 sprays into the nose daily.  Dispense: 10 g; Refill: 12 - cetirizine-pseudoephedrine (ZYRTEC-D) 5-120 MG tablet; Take 1 tablet by mouth 2 (two) times daily.  Dispense: 60 tablet; Refill: 0  3. Antibiotic-induced yeast  infection - fluconazole (DIFLUCAN) 150 MG tablet; Take 1 tablet (150 mg total) by mouth once for 1 dose. May repeat in 48-72 hours if needed  Dispense: 2 tablet; Refill: 0       Margaretann LovelessJennifer M Malena Timpone, PA-C  [facility]

## 2018-01-23 NOTE — Patient Instructions (Signed)

## 2018-01-26 ENCOUNTER — Telehealth: Payer: Self-pay | Admitting: Physician Assistant

## 2018-01-26 DIAGNOSIS — J0140 Acute pansinusitis, unspecified: Secondary | ICD-10-CM

## 2018-01-26 DIAGNOSIS — R05 Cough: Secondary | ICD-10-CM

## 2018-01-26 DIAGNOSIS — R059 Cough, unspecified: Secondary | ICD-10-CM

## 2018-01-26 MED ORDER — PREDNISONE 10 MG (21) PO TBPK: ORAL_TABLET | ORAL | 0 refills | Status: DC

## 2018-01-26 MED ORDER — BENZONATATE 200 MG PO CAPS
200.0000 mg | ORAL_CAPSULE | Freq: Three times a day (TID) | ORAL | 0 refills | Status: AC | PRN
Start: 2018-01-26 — End: ?

## 2018-01-26 MED ORDER — BENZONATATE 200 MG PO CAPS: 200.0000 mg | ORAL_CAPSULE | Freq: Three times a day (TID) | ORAL | 0 refills | Status: DC | PRN

## 2018-01-26 MED ORDER — PREDNISONE 10 MG (21) PO TBPK: ORAL_TABLET | ORAL | 0 refills | Status: AC

## 2018-01-26 NOTE — Telephone Encounter (Signed)
Sent in prednisone and tessalon perles.

## 2018-01-26 NOTE — Telephone Encounter (Signed)
Patient advised as below.  

## 2018-01-26 NOTE — Telephone Encounter (Signed)
Pt's mom Angie called requesting CMA contact Call pt @ [phone]. Angie stated pt has gotten worse [facility] Angie stated that pt has gotten a cough and feels like it has moved into her chest. Please advise. Thanks TNP
